# Patient Record
Sex: Female | Born: 1940 | Race: Black or African American | Hispanic: No | Smoking: Former smoker
Health system: Southern US, Community
[De-identification: ages and names within clinical notes are randomized; demographics above are authoritative.]

## PROBLEM LIST (undated history)

## (undated) DIAGNOSIS — E785 Hyperlipidemia, unspecified: Secondary | ICD-10-CM

## (undated) DIAGNOSIS — H9192 Unspecified hearing loss, left ear: Secondary | ICD-10-CM

## (undated) DIAGNOSIS — J302 Other seasonal allergic rhinitis: Secondary | ICD-10-CM

## (undated) HISTORY — DX: Unspecified hearing loss, left ear: H91.92

## (undated) HISTORY — PX: TUMOR EXCISION: SHX421

## (undated) HISTORY — PX: BREAST LUMPECTOMY: SHX2

## (undated) HISTORY — DX: Hyperlipidemia, unspecified: E78.5

## (undated) HISTORY — DX: Other seasonal allergic rhinitis: J30.2

---

## 2004-10-04 ENCOUNTER — Ambulatory Visit: Payer: Self-pay | Admitting: Internal Medicine

## 2004-10-09 ENCOUNTER — Ambulatory Visit: Payer: Self-pay | Admitting: Internal Medicine

## 2004-10-23 ENCOUNTER — Ambulatory Visit: Payer: Self-pay | Admitting: Internal Medicine

## 2004-11-19 ENCOUNTER — Ambulatory Visit: Payer: Self-pay | Admitting: Internal Medicine

## 2004-12-24 ENCOUNTER — Ambulatory Visit: Payer: Self-pay | Admitting: Internal Medicine

## 2004-12-25 ENCOUNTER — Ambulatory Visit (HOSPITAL_COMMUNITY): Admission: RE | Admit: 2004-12-25 | Discharge: 2004-12-25 | Payer: Self-pay | Admitting: Internal Medicine

## 2005-05-30 ENCOUNTER — Ambulatory Visit: Payer: Self-pay | Admitting: Internal Medicine

## 2005-06-11 ENCOUNTER — Emergency Department (HOSPITAL_COMMUNITY): Admission: EM | Admit: 2005-06-11 | Discharge: 2005-06-11 | Payer: Self-pay | Admitting: Emergency Medicine

## 2005-06-16 ENCOUNTER — Encounter: Admission: RE | Admit: 2005-06-16 | Discharge: 2005-06-16 | Payer: Self-pay | Admitting: Otolaryngology

## 2005-07-31 ENCOUNTER — Ambulatory Visit: Payer: Self-pay | Admitting: Internal Medicine

## 2005-08-13 ENCOUNTER — Ambulatory Visit: Payer: Self-pay | Admitting: Internal Medicine

## 2005-08-18 ENCOUNTER — Ambulatory Visit: Payer: Self-pay | Admitting: Gastroenterology

## 2005-08-27 ENCOUNTER — Encounter (INDEPENDENT_AMBULATORY_CARE_PROVIDER_SITE_OTHER): Payer: Self-pay | Admitting: *Deleted

## 2005-08-27 ENCOUNTER — Ambulatory Visit: Payer: Self-pay | Admitting: Gastroenterology

## 2005-12-28 ENCOUNTER — Emergency Department (HOSPITAL_COMMUNITY): Admission: EM | Admit: 2005-12-28 | Discharge: 2005-12-28 | Payer: Self-pay | Admitting: Emergency Medicine

## 2006-01-23 ENCOUNTER — Ambulatory Visit: Payer: Self-pay | Admitting: Hospitalist

## 2006-02-03 ENCOUNTER — Ambulatory Visit: Payer: Self-pay | Admitting: Internal Medicine

## 2006-02-09 ENCOUNTER — Ambulatory Visit (HOSPITAL_COMMUNITY): Admission: RE | Admit: 2006-02-09 | Discharge: 2006-02-09 | Payer: Self-pay | Admitting: Internal Medicine

## 2006-02-20 DIAGNOSIS — E119 Type 2 diabetes mellitus without complications: Secondary | ICD-10-CM | POA: Insufficient documentation

## 2006-04-30 DIAGNOSIS — D126 Benign neoplasm of colon, unspecified: Secondary | ICD-10-CM | POA: Insufficient documentation

## 2006-04-30 DIAGNOSIS — E785 Hyperlipidemia, unspecified: Secondary | ICD-10-CM | POA: Insufficient documentation

## 2006-04-30 DIAGNOSIS — H9319 Tinnitus, unspecified ear: Secondary | ICD-10-CM | POA: Insufficient documentation

## 2006-04-30 DIAGNOSIS — H9190 Unspecified hearing loss, unspecified ear: Secondary | ICD-10-CM | POA: Insufficient documentation

## 2007-02-23 ENCOUNTER — Ambulatory Visit (HOSPITAL_COMMUNITY): Admission: RE | Admit: 2007-02-23 | Discharge: 2007-02-23 | Payer: Self-pay | Admitting: Obstetrics & Gynecology

## 2008-02-25 ENCOUNTER — Ambulatory Visit (HOSPITAL_COMMUNITY): Admission: RE | Admit: 2008-02-25 | Discharge: 2008-02-25 | Payer: Self-pay | Admitting: Obstetrics & Gynecology

## 2009-02-26 ENCOUNTER — Ambulatory Visit (HOSPITAL_COMMUNITY): Admission: RE | Admit: 2009-02-26 | Discharge: 2009-02-26 | Payer: Self-pay | Admitting: Obstetrics & Gynecology

## 2009-04-15 ENCOUNTER — Encounter: Payer: Self-pay | Admitting: Internal Medicine

## 2009-04-16 ENCOUNTER — Ambulatory Visit: Payer: Self-pay | Admitting: Internal Medicine

## 2009-04-16 LAB — CONVERTED CEMR LAB
Albumin: 4.5 g/dL (ref 3.5–5.2)
BUN: 7 mg/dL (ref 6–23)
CO2: 27 meq/L (ref 19–32)
Candida species: NEGATIVE
Cholesterol: 242 mg/dL — ABNORMAL HIGH (ref 0–200)
Gardnerella vaginalis: NEGATIVE
Glucose, Bld: 114 mg/dL — ABNORMAL HIGH (ref 70–99)
HCT: 41 % (ref 36.0–46.0)
Hemoglobin: 13.3 g/dL (ref 12.0–15.0)
MCV: 81.7 fL (ref >=78.0)
RBC: 5.02 M/uL (ref 3.87–5.11)
Sodium: 142 meq/L (ref 135–145)
Total Bilirubin: 0.7 mg/dL (ref 0.3–1.2)
Total Protein: 6.9 g/dL (ref 6.0–8.3)
Triglycerides: 130 mg/dL (ref <150)
VLDL: 26 mg/dL (ref 0–40)
WBC: 6.4 10*3/uL (ref 4.0–10.5)

## 2009-04-19 ENCOUNTER — Encounter: Payer: Self-pay | Admitting: Internal Medicine

## 2010-03-15 ENCOUNTER — Ambulatory Visit (HOSPITAL_COMMUNITY): Admission: RE | Admit: 2010-03-15 | Discharge: 2010-03-15 | Payer: Self-pay | Admitting: Family Medicine

## 2010-03-21 ENCOUNTER — Encounter: Payer: Self-pay | Admitting: Internal Medicine

## 2010-06-04 NOTE — Miscellaneous (Signed)
Summary: Mammogram results  Clinical Lists Changes  Observations: Added new observation of PRIMARY MD: Nelda Bucks DO (03/21/2010 14:22)      Complete Medication List: 1)  Multivitamins Tabs (Multiple vitamin) .... Take one tablet by mouth once daily also take vema a dietary supplement as directed 2)  Os-cal 500 + D 500-400 Mg-unit Chew (Calcium carbonate-vitamin d) .... Take one tablet by mouth once daily

## 2010-08-06 LAB — GLUCOSE, CAPILLARY: Glucose-Capillary: 123 mg/dL — ABNORMAL HIGH (ref 70–99)

## 2010-09-27 ENCOUNTER — Emergency Department (HOSPITAL_COMMUNITY)
Admission: EM | Admit: 2010-09-27 | Discharge: 2010-09-27 | Disposition: A | Discharge status: Home / Self Care | Payer: Medicare Other | Attending: Emergency Medicine | Admitting: Emergency Medicine

## 2010-09-27 DIAGNOSIS — L2989 Other pruritus: Secondary | ICD-10-CM | POA: Insufficient documentation

## 2010-09-27 DIAGNOSIS — L298 Other pruritus: Secondary | ICD-10-CM | POA: Insufficient documentation

## 2010-09-27 DIAGNOSIS — R21 Rash and other nonspecific skin eruption: Secondary | ICD-10-CM | POA: Insufficient documentation

## 2010-09-27 DIAGNOSIS — L259 Unspecified contact dermatitis, unspecified cause: Secondary | ICD-10-CM | POA: Insufficient documentation

## 2010-10-07 ENCOUNTER — Encounter: Payer: Self-pay | Admitting: Internal Medicine

## 2011-08-07 ENCOUNTER — Emergency Department (HOSPITAL_COMMUNITY): Payer: No Typology Code available for payment source

## 2011-08-07 ENCOUNTER — Encounter (HOSPITAL_COMMUNITY): Payer: Self-pay | Admitting: Emergency Medicine

## 2011-08-07 ENCOUNTER — Emergency Department (HOSPITAL_COMMUNITY)
Admission: EM | Admit: 2011-08-07 | Discharge: 2011-08-07 | Disposition: A | Discharge status: Home Health | Payer: No Typology Code available for payment source | Attending: Emergency Medicine | Admitting: Emergency Medicine

## 2011-08-07 DIAGNOSIS — M545 Low back pain, unspecified: Secondary | ICD-10-CM | POA: Insufficient documentation

## 2011-08-07 DIAGNOSIS — T07XXXA Unspecified multiple injuries, initial encounter: Secondary | ICD-10-CM | POA: Insufficient documentation

## 2011-08-07 DIAGNOSIS — M25559 Pain in unspecified hip: Secondary | ICD-10-CM | POA: Insufficient documentation

## 2011-08-07 DIAGNOSIS — R079 Chest pain, unspecified: Secondary | ICD-10-CM | POA: Insufficient documentation

## 2011-08-07 DIAGNOSIS — R404 Transient alteration of awareness: Secondary | ICD-10-CM | POA: Insufficient documentation

## 2011-08-07 DIAGNOSIS — E785 Hyperlipidemia, unspecified: Secondary | ICD-10-CM | POA: Insufficient documentation

## 2011-08-07 DIAGNOSIS — M542 Cervicalgia: Secondary | ICD-10-CM | POA: Insufficient documentation

## 2011-08-07 DIAGNOSIS — E119 Type 2 diabetes mellitus without complications: Secondary | ICD-10-CM | POA: Insufficient documentation

## 2011-08-07 LAB — DIFFERENTIAL
Basophils Absolute: 0 10*3/uL (ref 0.0–0.1)
Lymphocytes Relative: 41 % (ref 12–46)
Neutro Abs: 3.4 10*3/uL (ref 1.7–7.7)

## 2011-08-07 LAB — POCT I-STAT, CHEM 8
HCT: 43 % (ref 36.0–46.0)
Hemoglobin: 14.6 g/dL (ref 12.0–15.0)
Potassium: 3.7 mEq/L (ref 3.5–5.1)
Sodium: 142 mEq/L (ref 135–145)
TCO2: 29 mmol/L (ref 0–100)

## 2011-08-07 LAB — CBC
Platelets: 300 10*3/uL (ref 150–400)
RDW: 13.2 % (ref 11.5–15.5)
WBC: 6.5 10*3/uL (ref 4.0–10.5)

## 2011-08-07 LAB — SAMPLE TO BLOOD BANK

## 2011-08-07 MED ORDER — MORPHINE SULFATE 4 MG/ML IJ SOLN
4.0000 mg | Freq: Once | INTRAMUSCULAR | Status: AC
  Administered 2011-08-07: 4 mg via INTRAVENOUS
  Filled 2011-08-07: qty 1

## 2011-08-07 MED ORDER — HYDROCODONE-ACETAMINOPHEN 5-325 MG PO TABS: 1.0000 | ORAL_TABLET | ORAL | Status: AC | PRN

## 2011-08-07 MED ORDER — SODIUM CHLORIDE 0.9 % IV SOLN
INTRAVENOUS | Status: DC
  Administered 2011-08-07: 125 mL/h via INTRAVENOUS

## 2011-08-07 MED ORDER — ONDANSETRON HCL 4 MG/2ML IJ SOLN
4.0000 mg | Freq: Once | INTRAMUSCULAR | Status: AC
  Administered 2011-08-07: 4 mg via INTRAVENOUS
  Filled 2011-08-07: qty 2
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
## 2013-01-21 DIAGNOSIS — M722 Plantar fascial fibromatosis: Secondary | ICD-10-CM | POA: Insufficient documentation

## 2013-01-27 ENCOUNTER — Encounter: Payer: Self-pay | Admitting: *Deleted

## 2013-01-27 DIAGNOSIS — M722 Plantar fascial fibromatosis: Secondary | ICD-10-CM

## 2013-01-27 DIAGNOSIS — J302 Other seasonal allergic rhinitis: Secondary | ICD-10-CM | POA: Insufficient documentation

## 2013-02-04 ENCOUNTER — Ambulatory Visit (INDEPENDENT_AMBULATORY_CARE_PROVIDER_SITE_OTHER): Payer: Medicare Other

## 2013-02-04 VITALS — BP 124/82 | HR 82 | Resp 12 | Ht 63.0 in | Wt 140.0 lb

## 2013-02-04 DIAGNOSIS — M722 Plantar fascial fibromatosis: Secondary | ICD-10-CM

## 2013-02-04 DIAGNOSIS — M7730 Calcaneal spur, unspecified foot: Secondary | ICD-10-CM

## 2013-02-04 DIAGNOSIS — M7732 Calcaneal spur, left foot: Secondary | ICD-10-CM

## 2013-02-04 NOTE — Progress Notes (Signed)
  Subjective:    Patient ID: Lisa Crane, female    DOB: 09/11/1940, 72 y.o.   MRN: [id number]  HPI Lisa Crane presents at this time for followup of plantar fasciitis/heel spur syndrome left foot the patient has been wearing a strapping using Mobic as instructed has complete resolution of symptoms 0/10 on a pain scale. Patient wearing good athletic or walking shoes. And is allowed to steadily return back to her training activities and workout program. On exam at this time there is no residual pain or symptoms of her left foot on palpation or ambulation   Review of Systems  Constitutional: Negative.   HENT: Negative.   Eyes: Negative.   Respiratory: Negative.   Cardiovascular: Negative.   Endocrine: Negative.   Genitourinary: Negative.   Allergic/Immunologic: Positive for environmental allergies.  Neurological: Negative.   Hematological: Negative.   Psychiatric/Behavioral: Negative.        Objective:   Physical Exam  Constitutional: She is oriented to person, place, and time. She appears well-developed and well-nourished.  Cardiovascular: Intact distal pulses.   Musculoskeletal: Normal range of motion.       Left foot: She exhibits no tenderness and no deformity.  There is complete resolution of pain on palpation. Medial calcaneal tubercle or mid band of plantar fascia left foot or left heel. Muscle strength intact and symmetric bilateral  Neurological: She is alert and oriented to person, place, and time. She has normal reflexes.  Skin: Skin is warm and dry.  Skin color texture and turgor unremarkable  Psychiatric: She has a normal mood and affect. Her behavior is normal. Judgment normal.          Assessment & Plan:  Patient has improvement with fascial strapping NSAID therapy with resolution of plantar fasciitis and heel spur syndrome. The only sequela I mild rash produced by the tape which patient will treat with topical Neosporin or Benadryl as recommend. Patient may  resume all activity a steady basis, while maintaining appropriate shoe gear and all-time  Patient may be a candidate for orthoses in the future if symptoms were to recur or exacerbate it  Alvan Dame DPM

## 2013-02-04 NOTE — Patient Instructions (Signed)

## 2013-04-21 ENCOUNTER — Emergency Department (HOSPITAL_COMMUNITY): Payer: Medicare Other

## 2013-04-21 ENCOUNTER — Emergency Department (HOSPITAL_COMMUNITY)
Admission: EM | Admit: 2013-04-21 | Discharge: 2013-04-21 | Disposition: A | Discharge status: Home / Self Care | Payer: Medicare Other | Attending: Emergency Medicine | Admitting: Emergency Medicine

## 2013-04-21 ENCOUNTER — Encounter (HOSPITAL_COMMUNITY): Payer: Self-pay | Admitting: Emergency Medicine

## 2013-04-21 DIAGNOSIS — Z9109 Other allergy status, other than to drugs and biological substances: Secondary | ICD-10-CM | POA: Insufficient documentation

## 2013-04-21 DIAGNOSIS — Z87891 Personal history of nicotine dependence: Secondary | ICD-10-CM | POA: Insufficient documentation

## 2013-04-21 DIAGNOSIS — M25519 Pain in unspecified shoulder: Secondary | ICD-10-CM | POA: Insufficient documentation

## 2013-04-21 DIAGNOSIS — H9190 Unspecified hearing loss, unspecified ear: Secondary | ICD-10-CM | POA: Insufficient documentation

## 2013-04-21 DIAGNOSIS — M62838 Other muscle spasm: Secondary | ICD-10-CM | POA: Insufficient documentation

## 2013-04-21 DIAGNOSIS — M25559 Pain in unspecified hip: Secondary | ICD-10-CM | POA: Insufficient documentation

## 2013-04-21 DIAGNOSIS — E119 Type 2 diabetes mellitus without complications: Secondary | ICD-10-CM | POA: Insufficient documentation

## 2013-04-21 DIAGNOSIS — IMO0002 Reserved for concepts with insufficient information to code with codable children: Secondary | ICD-10-CM | POA: Insufficient documentation

## 2013-04-21 DIAGNOSIS — M25552 Pain in left hip: Secondary | ICD-10-CM

## 2013-04-21 DIAGNOSIS — R079 Chest pain, unspecified: Secondary | ICD-10-CM | POA: Insufficient documentation

## 2013-04-21 MED ORDER — TRAMADOL HCL 50 MG PO TABS
50.0000 mg | ORAL_TABLET | Freq: Once | ORAL | Status: AC
  Administered 2013-04-21: 50 mg via ORAL
  Filled 2013-04-21: qty 1

## 2013-04-21 MED ORDER — PREDNISONE 20 MG PO TABS: 40.0000 mg | ORAL_TABLET | Freq: Every day | ORAL | Status: AC

## 2013-04-21 MED ORDER — TRAMADOL HCL 50 MG PO TABS: 50.0000 mg | ORAL_TABLET | Freq: Four times a day (QID) | ORAL | Status: DC | PRN

## 2013-04-21 MED ORDER — PREDNISONE 20 MG PO TABS
40.0000 mg | ORAL_TABLET | Freq: Once | ORAL | Status: AC
  Administered 2013-04-21: 40 mg via ORAL
  Filled 2013-04-21: qty 2

## 2013-04-21 NOTE — ED Notes (Signed)
Pt to xray on stretcher

## 2013-04-21 NOTE — ED Provider Notes (Signed)
CSN: [id number]     Arrival date & time 04/21/13  1420 History   First MD Initiated Contact with Patient 04/21/13 1821     Chief Complaint  Patient presents with  . Hip Pain   (Consider location/radiation/quality/duration/timing/severity/associated sxs/prior Treatment) HPI Comments: Pt denies fall, injury.  She reports she had some weakness in right arm that she was seen for previously.  She thinks whatever it was moved to left shoulder and now is in left hip area.  She feels weak, and has some pain.  No prior h/o stroke, TIA.  No h/o cardiac disease.  She has not taken any meds for this.  Pt admits to having some CP in left shoulder along with the weakness in left shoulder while in the waiting room "due to being there for so long" and it is now gone.  No SOB, sweats, nausea with the CP.    Patient is a 72 y.o. female presenting with hip pain. The history is provided by the patient.  Hip Pain This is a new problem. The current episode started 12 to 24 hours ago. The problem occurs constantly. The problem has not changed since onset.Associated symptoms include chest pain. Pertinent negatives include no headaches and no shortness of breath.    Past Medical History  Diagnosis Date  . Diabetes mellitus   . Hyperlipidemia   . Dyslipidemia   . Tinnitus     resolved after d/c aspirin  . Left ear hearing loss   . Seasonal allergies    Past Surgical History  Procedure Laterality Date  . Tumor excision N/A     ON BACK  . Breast lumpectomy N/A    Family History  Problem Relation Age of Onset  . Stroke Mother    History  Substance Use Topics  . Smoking status: Former Smoker -- 4.00 packs/day for 15 years    Quit date: 05/05/1970  . Smokeless tobacco: Not on file  . Alcohol Use: No   OB History   Grav Para Term Preterm Abortions TAB SAB Ect Mult Living                 Review of Systems  Constitutional: Negative for fever and diaphoresis.  Respiratory: Negative for cough and  shortness of breath.   Cardiovascular: Positive for chest pain.  Gastrointestinal: Negative for nausea.  Musculoskeletal: Positive for arthralgias.  Neurological: Positive for weakness. Negative for numbness and headaches.    Allergies  Aspirin  Home Medications   Current Outpatient Rx  Name  Route  Sig  Dispense  Refill  . predniSONE (DELTASONE) 20 MG tablet   Oral   Take 2 tablets (40 mg total) by mouth daily.   12 tablet   0   . traMADol (ULTRAM) 50 MG tablet   Oral   Take 1 tablet (50 mg total) by mouth every 6 (six) hours as needed.   15 tablet   0    BP 121/51  Pulse 85  Temp(Src) 99.2 F (37.3 C) (Oral)  Resp 20  Ht 5\' 3"  (1.6 m)  Wt 154 lb 12.8 oz (70.217 kg)  BMI 27.43 kg/m2  SpO2 100% Physical Exam  Nursing note and vitals reviewed. Constitutional: She is oriented to person, place, and time. She appears well-developed and well-nourished.  HENT:  Head: Normocephalic and atraumatic.  Eyes: Conjunctivae and EOM are normal. Pupils are equal, round, and reactive to light.  Neck: Normal range of motion. No spinous process tenderness and no muscular tenderness  present.  Cardiovascular: Normal rate, regular rhythm and intact distal pulses.   No murmur heard. Pulmonary/Chest: Effort normal. No respiratory distress. She has no wheezes.  Abdominal: Soft. She exhibits no distension. There is no tenderness. There is no rebound and no guarding.  Musculoskeletal:       Right shoulder: She exhibits normal range of motion, no tenderness, normal pulse and normal strength.       Left shoulder: She exhibits normal range of motion, no tenderness, normal pulse and normal strength.       Right hip: She exhibits normal range of motion, normal strength, no tenderness and no deformity.       Left hip: She exhibits decreased strength. She exhibits normal range of motion, no tenderness and no deformity.       Lumbar back: She exhibits no tenderness, no bony tenderness, no swelling,  no pain, no spasm and normal pulse.  Neurological: She is alert and oriented to person, place, and time. She exhibits normal muscle tone. Coordination normal.  Skin: Skin is warm and dry. No rash noted. She is not diaphoretic. No pallor.  Psychiatric: She has a normal mood and affect.    ED Course  Procedures (including critical care time) Labs Review Labs Reviewed - No data to display Imaging Review Dg Hip Complete Left  04/21/2013   CLINICAL DATA:  Left hip pain  EXAM: LEFT HIP - COMPLETE 2+ VIEW  COMPARISON:  08/06/2012 pelvic radiograph  FINDINGS: Osseous demineralization.  Joint spaces preserved.  No acute fracture, dislocation, or bone destruction.  Probable phlebolith projecting over left sacrum.  Minimal osteitis pubis.  Focal area of sclerosis at right ischium unchanged since 08/07/2011.  IMPRESSION: No acute abnormalities.   Electronically Signed   By: Ulyses Southward M.D.   On: 04/21/2013 19:40    EKG Interpretation    Date/Time:  Thursday April 21 2013 17:53:33 EST Ventricular Rate:  77 PR Interval:  162 QRS Duration: 88 QT Interval:  376 QTC Calculation: 425 R Axis:   78 Text Interpretation:  Normal sinus rhythm Nonspecific T wave abnormality , new Borderline ECG Confirmed by [facility]  MD, MICHEAL ([id number]) on 04/21/2013 7:05:35 PM           Room air saturation is 96% and I interpret this to be adequate  Plain films reviewed.  Pt is reassured.  NO evidence of TIA, CVA.  No acute ischemia on ECG and pt has no CP at time of assessment or at any time during ED evaluation.  Pt's symptoms in arms were transient, migratory.  Pt able to ambulate, no emergent condition.  Can follow up with PCP.  Symptom treatment and steroids Rx given.  MDM   1. Pain in left hip      Patient denies any injury. No lumbar pain, spasms or step-offs. Patient has good range of motion of left hip without significant change in pain. She does have 4/5 strength to left hip extension, 5 over 5 distal  strength with plantar and dorsi flexion of ankle. Gross sensation is intact left lower extremity. Plan will be to get plain films of her hip. I suspect the patient may have some sciatica discomfort. No great explanation for why the patient had transient symptoms first in her right shoulder than her left. Doubt this is being caused by transient ischemic activity.  ECG obtained from triage due to complaint of CP.  Given no SOB, nausea and pain was very transient, doubt ACS.  ECG shows subtle  non specific changes, doubt this is ACS.      Gavin Pound. Rhyder Bratz, MD 04/23/13 [phone]

## 2013-04-21 NOTE — ED Notes (Signed)
Pt reports right arm hurting on Friday, pain is relieved but now having pain all over and specifically to left hip, no hx of injury. Ambulatory at triage, no distress noted.

## 2013-04-21 NOTE — ED Notes (Signed)
Pt in the waiting room sts now she is having chest pain

## 2013-06-16 ENCOUNTER — Emergency Department (HOSPITAL_COMMUNITY)
Admission: EM | Admit: 2013-06-16 | Discharge: 2013-06-16 | Discharge status: Absent without leave | Payer: Medicare Other | Source: Home / Self Care

## 2014-03-10 ENCOUNTER — Encounter: Payer: Self-pay | Admitting: Gastroenterology

## 2014-07-17 ENCOUNTER — Encounter (HOSPITAL_COMMUNITY): Payer: Self-pay | Admitting: Emergency Medicine

## 2014-07-17 ENCOUNTER — Emergency Department (HOSPITAL_COMMUNITY): Payer: Medicare HMO

## 2014-07-17 ENCOUNTER — Emergency Department (HOSPITAL_COMMUNITY)
Admission: EM | Admit: 2014-07-17 | Discharge: 2014-07-17 | Disposition: A | Discharge status: Home / Self Care | Payer: Medicare HMO | Attending: Emergency Medicine | Admitting: Emergency Medicine

## 2014-07-17 DIAGNOSIS — S99911A Unspecified injury of right ankle, initial encounter: Secondary | ICD-10-CM | POA: Diagnosis not present

## 2014-07-17 DIAGNOSIS — S8991XA Unspecified injury of right lower leg, initial encounter: Secondary | ICD-10-CM | POA: Insufficient documentation

## 2014-07-17 DIAGNOSIS — S79911A Unspecified injury of right hip, initial encounter: Secondary | ICD-10-CM | POA: Insufficient documentation

## 2014-07-17 DIAGNOSIS — E119 Type 2 diabetes mellitus without complications: Secondary | ICD-10-CM | POA: Insufficient documentation

## 2014-07-17 DIAGNOSIS — Y9389 Activity, other specified: Secondary | ICD-10-CM | POA: Insufficient documentation

## 2014-07-17 DIAGNOSIS — H9192 Unspecified hearing loss, left ear: Secondary | ICD-10-CM | POA: Insufficient documentation

## 2014-07-17 DIAGNOSIS — M25571 Pain in right ankle and joints of right foot: Secondary | ICD-10-CM

## 2014-07-17 DIAGNOSIS — Z87891 Personal history of nicotine dependence: Secondary | ICD-10-CM | POA: Insufficient documentation

## 2014-07-17 DIAGNOSIS — W000XXA Fall on same level due to ice and snow, initial encounter: Secondary | ICD-10-CM | POA: Insufficient documentation

## 2014-07-17 DIAGNOSIS — R52 Pain, unspecified: Secondary | ICD-10-CM

## 2014-07-17 DIAGNOSIS — Z7952 Long term (current) use of systemic steroids: Secondary | ICD-10-CM | POA: Diagnosis not present

## 2014-07-17 DIAGNOSIS — M25561 Pain in right knee: Secondary | ICD-10-CM

## 2014-07-17 DIAGNOSIS — Y998 Other external cause status: Secondary | ICD-10-CM | POA: Insufficient documentation

## 2014-07-17 DIAGNOSIS — Y9289 Other specified places as the place of occurrence of the external cause: Secondary | ICD-10-CM | POA: Insufficient documentation

## 2014-07-17 MED ORDER — TRAMADOL HCL 50 MG PO TABS: 50.0000 mg | ORAL_TABLET | Freq: Four times a day (QID) | ORAL | Status: AC | PRN

## 2014-07-17 NOTE — Discharge Instructions (Signed)
Read the information below.  Use the prescribed medication as directed.  Please discuss all new medications with your pharmacist.  You may return to the Emergency Department at any time for worsening condition or any new symptoms that concern you.   If you develop fever, uncontrolled pain, weakness or numbness of the extremity, severe discoloration of the skin, or you are unable to walk, return to the ER for a recheck.      Ankle Pain Ankle pain is a common symptom. The bones, cartilage, tendons, and muscles of the ankle joint perform a lot of work each day. The ankle joint holds your body weight and allows you to move around. Ankle pain can occur on either side or back of 1 or both ankles. Ankle pain may be sharp and burning or dull and aching. There may be tenderness, stiffness, redness, or warmth around the ankle. The pain occurs more often when a person walks or puts pressure on the ankle. CAUSES  There are many reasons ankle pain can develop. It is important to work with your caregiver to identify the cause since many conditions can impact the bones, cartilage, muscles, and tendons. Causes for ankle pain include:  Injury, including a break (fracture), sprain, or strain often due to a fall, sports, or a high-impact activity.  Swelling (inflammation) of a tendon (tendonitis).  Achilles tendon rupture.  Ankle instability after repeated sprains and strains.  Poor foot alignment.  Pressure on a nerve (tarsal tunnel syndrome).  Arthritis in the ankle or the lining of the ankle.  Crystal formation in the ankle (gout or pseudogout). DIAGNOSIS  A diagnosis is based on your medical history, your symptoms, results of your physical exam, and results of diagnostic tests. Diagnostic tests may include X-ray exams or a computerized magnetic scan (magnetic resonance imaging, MRI). TREATMENT  Treatment will depend on the cause of your ankle pain and may include:  Keeping pressure off the ankle and  limiting activities.  Using crutches or other walking support (a cane or brace).  Using rest, ice, compression, and elevation.  Participating in physical therapy or home exercises.  Wearing shoe inserts or special shoes.  Losing weight.  Taking medications to reduce pain or swelling or receiving an injection.  Undergoing surgery. HOME CARE INSTRUCTIONS   Only take over-the-counter or prescription medicines for pain, discomfort, or fever as directed by your caregiver.  Put ice on the injured area.  Put ice in a plastic bag.  Place a towel between your skin and the bag.  Leave the ice on for 15-20 minutes at a time, 03-04 times a day.  Keep your leg raised (elevated) when possible to lessen swelling.  Avoid activities that cause ankle pain.  Follow specific exercises as directed by your caregiver.  Record how often you have ankle pain, the location of the pain, and what it feels like. This information may be helpful to you and your caregiver.  Ask your caregiver about returning to work or sports and whether you should drive.  Follow up with your caregiver for further examination, therapy, or testing as directed. SEEK MEDICAL CARE IF:   Pain or swelling continues or worsens beyond 1 week.  You have an oral temperature above 102 F (38.9 C).  You are feeling unwell or have chills.  You are having an increasingly difficult time with walking.  You have loss of sensation or other new symptoms.  You have questions or concerns. MAKE SURE YOU:   Understand these instructions.  Will watch your condition.  Will get help right away if you are not doing well or get worse. Document Released: 10/09/2009 Document Revised: 07/14/2011 Document Reviewed: 10/09/2009 [facility] Patient Information 2015 [place], [place]. This information is not intended to replace advice given to you by your health care provider. Make sure you discuss any questions you have with your health care  provider.  Knee Pain The knee is the complex joint between your thigh and your lower leg. It is made up of bones, tendons, ligaments, and cartilage. The bones that make up the knee are:  The femur in the thigh.  The tibia and fibula in the lower leg.  The patella or kneecap riding in the groove on the lower femur. CAUSES  Knee pain is a common complaint with many causes. A few of these causes are:  Injury, such as:  A ruptured ligament or tendon injury.  Torn cartilage.  Medical conditions, such as:  Gout  Arthritis  Infections  Overuse, over training, or overdoing a physical activity. Knee pain can be minor or severe. Knee pain can accompany debilitating injury. Minor knee problems often respond well to self-care measures or get well on their own. More serious injuries may need medical intervention or even surgery. SYMPTOMS The knee is complex. Symptoms of knee problems can vary widely. Some of the problems are:  Pain with movement and weight bearing.  Swelling and tenderness.  Buckling of the knee.  Inability to straighten or extend your knee.  Your knee locks and you cannot straighten it.  Warmth and redness with pain and fever.  Deformity or dislocation of the kneecap. DIAGNOSIS  Determining what is wrong may be very straight forward such as when there is an injury. It can also be challenging because of the complexity of the knee. Tests to make a diagnosis may include:  Your caregiver taking a history and doing a physical exam.  Routine X-rays can be used to rule out other problems. X-rays will not reveal a cartilage tear. Some injuries of the knee can be diagnosed by:  Arthroscopy a surgical technique by which a small video camera is inserted through tiny incisions on the sides of the knee. This procedure is used to examine and repair internal knee joint problems. Tiny instruments can be used during arthroscopy to repair the torn knee cartilage  (meniscus).  Arthrography is a radiology technique. A contrast liquid is directly injected into the knee joint. Internal structures of the knee joint then become visible on X-ray film.  An MRI scan is a non X-ray radiology procedure in which magnetic fields and a computer produce two- or three-dimensional images of the inside of the knee. Cartilage tears are often visible using an MRI scanner. MRI scans have largely replaced arthrography in diagnosing cartilage tears of the knee.  Blood work.  Examination of the fluid that helps to lubricate the knee joint (synovial fluid). This is done by taking a sample out using a needle and a syringe. TREATMENT The treatment of knee problems depends on the cause. Some of these treatments are:  Depending on the injury, proper casting, splinting, surgery, or physical therapy care will be needed.  Give yourself adequate recovery time. Do not overuse your joints. If you begin to get sore during workout routines, back off. Slow down or do fewer repetitions.  For repetitive activities such as cycling or running, maintain your strength and nutrition.  Alternate muscle groups. For example, if you are a weight lifter, work the  upper body on one day and the lower body the next.  Either tight or weak muscles do not give the proper support for your knee. Tight or weak muscles do not absorb the stress placed on the knee joint. Keep the muscles surrounding the knee strong.  Take care of mechanical problems.  If you have flat feet, orthotics or special shoes may help. See your caregiver if you need help.  Arch supports, sometimes with wedges on the inner or outer aspect of the heel, can help. These can shift pressure away from the side of the knee most bothered by osteoarthritis.  A brace called an "unloader" brace also may be used to help ease the pressure on the most arthritic side of the knee.  If your caregiver has prescribed crutches, braces, wraps or ice,  use as directed. The acronym for this is PRICE. This means protection, rest, ice, compression, and elevation.  Nonsteroidal anti-inflammatory drugs (NSAIDs), can help relieve pain. But if taken immediately after an injury, they may actually increase swelling. Take NSAIDs with food in your stomach. Stop them if you develop stomach problems. Do not take these if you have a history of ulcers, stomach pain, or bleeding from the bowel. Do not take without your caregiver's approval if you have problems with fluid retention, heart failure, or kidney problems.  For ongoing knee problems, physical therapy may be helpful.  Glucosamine and chondroitin are over-the-counter dietary supplements. Both may help relieve the pain of osteoarthritis in the knee. These medicines are different from the usual anti-inflammatory drugs. Glucosamine may decrease the rate of cartilage destruction.  Injections of a corticosteroid drug into your knee joint may help reduce the symptoms of an arthritis flare-up. They may provide pain relief that lasts a few months. You may have to wait a few months between injections. The injections do have a small increased risk of infection, water retention, and elevated blood sugar levels.  Hyaluronic acid injected into damaged joints may ease pain and provide lubrication. These injections may work by reducing inflammation. A series of shots may give relief for as long as 6 months.  Topical painkillers. Applying certain ointments to your skin may help relieve the pain and stiffness of osteoarthritis. Ask your pharmacist for suggestions. Many over the-counter products are approved for temporary relief of arthritis pain.  In some countries, doctors often prescribe topical NSAIDs for relief of chronic conditions such as arthritis and tendinitis. A review of treatment with NSAID creams found that they worked as well as oral medications but without the serious side effects. PREVENTION  Maintain a  healthy weight. Extra pounds put more strain on your joints.  Get strong, stay limber. Weak muscles are a common cause of knee injuries. Stretching is important. Include flexibility exercises in your workouts.  Be smart about exercise. If you have osteoarthritis, chronic knee pain or recurring injuries, you may need to change the way you exercise. This does not mean you have to stop being active. If your knees ache after jogging or playing basketball, consider switching to swimming, water aerobics, or other low-impact activities, at least for a few days a week. Sometimes limiting high-impact activities will provide relief.  Make sure your shoes fit well. Choose footwear that is right for your sport.  Protect your knees. Use the proper gear for knee-sensitive activities. Use kneepads when playing volleyball or laying carpet. Buckle your seat belt every time you drive. Most shattered kneecaps occur in car accidents.  Rest when you are  tired. SEEK MEDICAL CARE IF:  You have knee pain that is continual and does not seem to be getting better.  SEEK IMMEDIATE MEDICAL CARE IF:  Your knee joint feels hot to the touch and you have a high fever. MAKE SURE YOU:   Understand these instructions.  Will watch your condition.  Will get help right away if you are not doing well or get worse. Document Released: 02/16/2007 Document Revised: 07/14/2011 Document Reviewed: 02/16/2007 [facility] Patient Information 2015 [place], [place]. This information is not intended to replace advice given to you by your health care provider. Make sure you discuss any questions you have with your health care provider.

## 2014-07-17 NOTE — ED Notes (Signed)
Declined W/C at D/C and was escorted to lobby by RN. 

## 2014-07-17 NOTE — ED Provider Notes (Signed)
CSN: [id number]     Arrival date & time 07/17/14  1326 History  This chart was scribed for non-physician practitioner, Clayton Bibles, PA-C, working with Malvin Johns, MD by Ladene Artist, ED Scribe. This patient was seen in room TR07C/TR07C and the patient's care was started at 3:56 PM.   Chief Complaint  Patient presents with  . Knee Pain  . Foot Pain   The history is provided by the patient. No language interpreter was used.   HPI Comments: Lisa Crane is a 74 y.o. female, with a h/o DM, hyperlipidemia, who presents to the Emergency Department complaining of a slip and fall that occurred 2 weeks ago when it snowed. Pt reports persistent R foot and R knee pain since fall that is exacerbated with movement and bearing weight.  She has been able to walk around her home on a flat surface but had to be carried out of the house as she was not able to traverse the stairs due to pain. She denies hitting her head, LOC, weakness, numbness, fever, chest pain, SOB, myalgias, nausea, vomiting. Pt has been treating with ice and Advil with minimal relief. No h/o gout.   Past Medical History  Diagnosis Date  . Diabetes mellitus   . Hyperlipidemia   . Dyslipidemia   . Tinnitus     resolved after d/c aspirin  . Left ear hearing loss   . Seasonal allergies    Past Surgical History  Procedure Laterality Date  . Tumor excision N/A     ON BACK  . Breast lumpectomy N/A    Family History  Problem Relation Age of Onset  . Stroke Mother    History  Substance Use Topics  . Smoking status: Former Smoker -- 4.00 packs/day for 15 years    Quit date: 05/05/1970  . Smokeless tobacco: Not on file  . Alcohol Use: No   OB History    No data available     Review of Systems  Constitutional: Negative for fever and chills.  Respiratory: Negative for shortness of breath.   Cardiovascular: Negative for chest pain.  Gastrointestinal: Negative for nausea, vomiting and abdominal pain.  Musculoskeletal:  Positive for arthralgias. Negative for myalgias and back pain.  Skin: Negative for color change, pallor, rash and wound.  Allergic/Immunologic: Positive for immunocompromised state (diabetic).  Neurological: Negative for weakness, numbness and headaches.  Hematological: Does not bruise/bleed easily.  Psychiatric/Behavioral: Negative for self-injury (accidental, mechanical fall).   Allergies  Aspirin  Home Medications   Prior to Admission medications   Medication Sig Start Date End Date Taking? Authorizing Provider  predniSONE (DELTASONE) 20 MG tablet Take 2 tablets (40 mg total) by mouth daily. 04/21/13   Kingsley Spittle, MD  traMADol (ULTRAM) 50 MG tablet Take 1 tablet (50 mg total) by mouth every 6 (six) hours as needed. 04/21/13   Kingsley Spittle, MD   BP 115/55 mmHg  Pulse 87  Temp(Src) 99.1 F (37.3 C) (Oral)  Resp 18  SpO2 100% Physical Exam  Constitutional: She appears well-developed and well-nourished. No distress.  HENT:  Head: Normocephalic and atraumatic.  Neck: Neck supple.  Cardiovascular: Normal rate.   Pulmonary/Chest: Effort normal. She exhibits no tenderness.  Abdominal: Soft. There is no tenderness.  Musculoskeletal: Normal range of motion. She exhibits no edema.       Legs:      Feet:  Right hip with pain with passive ROM. Right knee without focal tenderness, no erythema, edema, warmth, mild pain with anterior drawer.  Right ankle with medial edema and tenderness, mild erythema.  Dorsalis pedis pulses intact.  Sensation intact.    Neurological: She is alert.  Skin: She is not diaphoretic.  Nursing note and vitals reviewed.  ED Course  Procedures (including critical care time) DIAGNOSTIC STUDIES: Oxygen Saturation is 100% on RA, normal by my interpretation.    COORDINATION OF CARE: 4:00 PM-Discussed treatment plan which includes XR with pt at bedside and pt agreed to plan.   Labs Review Labs Reviewed - No data to display  Imaging Review Dg Knee  Complete 4 Views Right  07/17/2014   CLINICAL DATA:  The patient reports she slipped in snow 3 weeks ago with a twisting injury of the right knee. Continued pain. Initial encounter.  EXAM: RIGHT KNEE - COMPLETE 4+ VIEW  COMPARISON:  None.  FINDINGS: No acute bony or joint abnormality is identified. There is no joint effusion. Joint spaces are preserved. Mild chondrocalcinosis is noted.  IMPRESSION: No acute abnormality or finding to explain the patient's symptoms.   Electronically Signed   By: Inge Rise M.D.   On: 07/17/2014 15:05   Dg Foot Complete Right  07/17/2014   CLINICAL DATA:  Medial right foot pain since slipping in snow 3 weeks ago. Initial encounter.  EXAM: RIGHT FOOT COMPLETE - 3+ VIEW  COMPARISON:  None.  FINDINGS: No acute bony or joint abnormality is identified. The patient appears to be status post hallux valgus repair. Small plantar calcaneal spur is noted. Soft tissues are unremarkable.  IMPRESSION: No acute finding or abnormality to explain the patient's symptoms.   Electronically Signed   By: Inge Rise M.D.   On: 07/17/2014 15:02    EKG Interpretation None      MDM   Final diagnoses:  Pain  Right ankle pain  Right knee pain    Afebrile, nontoxic patient with injury to her right knee and ankle that occurred 2 weeks ago when she slipped and fell in the snow.  Xray of knee and ankle negative, ordered in triage.  I added right hip film that is pending at the change of shift. Neurovascularly intact.  Pt signed out to Delos Haring, PA-C, at change of shift pending hip film.  If negative, anticipate ambulating patient, if able to ambulate well, d/c home with tramadol, PCP vs ortho follow up, with walker prescription.     I personally performed the services described in this documentation, which was scribed in my presence. The recorded information has been reviewed and is accurate.    Clayton Bibles, PA-C 07/17/14 1723  Malvin Johns, MD 07/17/14 1946

## 2014-07-17 NOTE — ED Notes (Signed)
Pt currently in xray

## 2014-07-17 NOTE — ED Notes (Signed)
Pt c/o right foot and knee pain x 2 weeks since slipped on snow; pt sts difficulty walking due to pain

## 2016-01-25 ENCOUNTER — Ambulatory Visit: Payer: Medicare Other | Admitting: *Deleted

## 2016-11-25 DIAGNOSIS — Z79899 Other long term (current) drug therapy: Secondary | ICD-10-CM | POA: Diagnosis not present

## 2016-11-25 DIAGNOSIS — E119 Type 2 diabetes mellitus without complications: Secondary | ICD-10-CM | POA: Diagnosis not present

## 2016-11-25 DIAGNOSIS — E559 Vitamin D deficiency, unspecified: Secondary | ICD-10-CM | POA: Diagnosis not present

## 2016-11-25 DIAGNOSIS — R5383 Other fatigue: Secondary | ICD-10-CM | POA: Diagnosis not present

## 2016-11-25 DIAGNOSIS — E785 Hyperlipidemia, unspecified: Secondary | ICD-10-CM | POA: Diagnosis not present

## 2016-11-25 DIAGNOSIS — E089 Diabetes mellitus due to underlying condition without complications: Secondary | ICD-10-CM | POA: Diagnosis not present

## 2016-11-25 DIAGNOSIS — E08618 Diabetes mellitus due to underlying condition with other diabetic arthropathy: Secondary | ICD-10-CM | POA: Diagnosis not present

## 2017-04-22 DIAGNOSIS — M129 Arthropathy, unspecified: Secondary | ICD-10-CM | POA: Diagnosis not present

## 2017-04-22 DIAGNOSIS — Z79899 Other long term (current) drug therapy: Secondary | ICD-10-CM | POA: Diagnosis not present

## 2017-04-22 DIAGNOSIS — F0390 Unspecified dementia without behavioral disturbance: Secondary | ICD-10-CM | POA: Diagnosis not present

## 2017-04-22 DIAGNOSIS — E089 Diabetes mellitus due to underlying condition without complications: Secondary | ICD-10-CM | POA: Diagnosis not present

## 2017-04-22 DIAGNOSIS — E785 Hyperlipidemia, unspecified: Secondary | ICD-10-CM | POA: Diagnosis not present

## 2017-09-02 DIAGNOSIS — Z79899 Other long term (current) drug therapy: Secondary | ICD-10-CM | POA: Diagnosis not present

## 2017-09-02 DIAGNOSIS — E782 Mixed hyperlipidemia: Secondary | ICD-10-CM | POA: Diagnosis not present

## 2017-09-02 DIAGNOSIS — F0281 Dementia in other diseases classified elsewhere with behavioral disturbance: Secondary | ICD-10-CM | POA: Diagnosis not present

## 2017-09-02 DIAGNOSIS — E089 Diabetes mellitus due to underlying condition without complications: Secondary | ICD-10-CM | POA: Diagnosis not present

## 2017-09-02 DIAGNOSIS — R5383 Other fatigue: Secondary | ICD-10-CM | POA: Diagnosis not present

## 2017-09-02 DIAGNOSIS — M129 Arthropathy, unspecified: Secondary | ICD-10-CM | POA: Diagnosis not present

## 2017-09-02 DIAGNOSIS — G301 Alzheimer's disease with late onset: Secondary | ICD-10-CM | POA: Diagnosis not present

## 2017-09-16 DIAGNOSIS — E119 Type 2 diabetes mellitus without complications: Secondary | ICD-10-CM | POA: Diagnosis not present

## 2017-09-16 DIAGNOSIS — E785 Hyperlipidemia, unspecified: Secondary | ICD-10-CM | POA: Diagnosis not present

## 2017-09-16 DIAGNOSIS — F0390 Unspecified dementia without behavioral disturbance: Secondary | ICD-10-CM | POA: Diagnosis not present

## 2017-09-16 DIAGNOSIS — E559 Vitamin D deficiency, unspecified: Secondary | ICD-10-CM | POA: Diagnosis not present

## 2017-12-30 DIAGNOSIS — F0390 Unspecified dementia without behavioral disturbance: Secondary | ICD-10-CM | POA: Diagnosis not present

## 2017-12-30 DIAGNOSIS — E785 Hyperlipidemia, unspecified: Secondary | ICD-10-CM | POA: Diagnosis not present

## 2017-12-30 DIAGNOSIS — E119 Type 2 diabetes mellitus without complications: Secondary | ICD-10-CM | POA: Diagnosis not present

## 2017-12-30 DIAGNOSIS — E559 Vitamin D deficiency, unspecified: Secondary | ICD-10-CM | POA: Diagnosis not present

## 2017-12-30 DIAGNOSIS — Z79899 Other long term (current) drug therapy: Secondary | ICD-10-CM | POA: Diagnosis not present

## 2018-01-13 DIAGNOSIS — F0390 Unspecified dementia without behavioral disturbance: Secondary | ICD-10-CM | POA: Diagnosis not present

## 2018-01-13 DIAGNOSIS — E559 Vitamin D deficiency, unspecified: Secondary | ICD-10-CM | POA: Diagnosis not present

## 2018-01-13 DIAGNOSIS — E785 Hyperlipidemia, unspecified: Secondary | ICD-10-CM | POA: Diagnosis not present

## 2018-01-13 DIAGNOSIS — E119 Type 2 diabetes mellitus without complications: Secondary | ICD-10-CM | POA: Diagnosis not present

## 2018-05-11 DIAGNOSIS — Z79899 Other long term (current) drug therapy: Secondary | ICD-10-CM | POA: Diagnosis not present

## 2018-05-11 DIAGNOSIS — E785 Hyperlipidemia, unspecified: Secondary | ICD-10-CM | POA: Diagnosis not present

## 2018-05-11 DIAGNOSIS — E559 Vitamin D deficiency, unspecified: Secondary | ICD-10-CM | POA: Diagnosis not present

## 2018-05-11 DIAGNOSIS — E089 Diabetes mellitus due to underlying condition without complications: Secondary | ICD-10-CM | POA: Diagnosis not present

## 2018-05-21 DIAGNOSIS — F0390 Unspecified dementia without behavioral disturbance: Secondary | ICD-10-CM | POA: Diagnosis not present

## 2018-05-21 DIAGNOSIS — E785 Hyperlipidemia, unspecified: Secondary | ICD-10-CM | POA: Diagnosis not present

## 2018-05-21 DIAGNOSIS — E089 Diabetes mellitus due to underlying condition without complications: Secondary | ICD-10-CM | POA: Diagnosis not present

## 2018-05-21 DIAGNOSIS — Z789 Other specified health status: Secondary | ICD-10-CM | POA: Diagnosis not present

## 2018-06-14 DIAGNOSIS — G458 Other transient cerebral ischemic attacks and related syndromes: Secondary | ICD-10-CM | POA: Diagnosis not present

## 2018-06-14 DIAGNOSIS — Z79899 Other long term (current) drug therapy: Secondary | ICD-10-CM | POA: Diagnosis not present

## 2018-06-14 DIAGNOSIS — R262 Difficulty in walking, not elsewhere classified: Secondary | ICD-10-CM | POA: Diagnosis not present

## 2018-06-14 DIAGNOSIS — G4489 Other headache syndrome: Secondary | ICD-10-CM | POA: Diagnosis not present

## 2018-06-14 DIAGNOSIS — F0390 Unspecified dementia without behavioral disturbance: Secondary | ICD-10-CM | POA: Diagnosis not present

## 2018-06-14 DIAGNOSIS — Z982 Presence of cerebrospinal fluid drainage device: Secondary | ICD-10-CM | POA: Diagnosis not present

## 2018-06-14 DIAGNOSIS — N390 Urinary tract infection, site not specified: Secondary | ICD-10-CM | POA: Diagnosis not present

## 2018-06-14 DIAGNOSIS — R29818 Other symptoms and signs involving the nervous system: Secondary | ICD-10-CM | POA: Diagnosis not present

## 2018-06-14 DIAGNOSIS — D259 Leiomyoma of uterus, unspecified: Secondary | ICD-10-CM | POA: Diagnosis not present

## 2018-06-14 DIAGNOSIS — G319 Degenerative disease of nervous system, unspecified: Secondary | ICD-10-CM | POA: Diagnosis not present

## 2018-06-14 DIAGNOSIS — R2689 Other abnormalities of gait and mobility: Secondary | ICD-10-CM | POA: Diagnosis not present

## 2018-06-14 DIAGNOSIS — M5416 Radiculopathy, lumbar region: Secondary | ICD-10-CM | POA: Diagnosis not present

## 2018-06-15 DIAGNOSIS — Z79899 Other long term (current) drug therapy: Secondary | ICD-10-CM | POA: Diagnosis not present

## 2018-06-15 DIAGNOSIS — R2689 Other abnormalities of gait and mobility: Secondary | ICD-10-CM | POA: Diagnosis not present

## 2018-06-15 DIAGNOSIS — N390 Urinary tract infection, site not specified: Secondary | ICD-10-CM | POA: Diagnosis not present

## 2018-06-15 DIAGNOSIS — D259 Leiomyoma of uterus, unspecified: Secondary | ICD-10-CM | POA: Diagnosis not present

## 2018-06-15 DIAGNOSIS — Z982 Presence of cerebrospinal fluid drainage device: Secondary | ICD-10-CM | POA: Diagnosis not present

## 2018-06-15 DIAGNOSIS — G319 Degenerative disease of nervous system, unspecified: Secondary | ICD-10-CM | POA: Diagnosis not present

## 2018-06-15 DIAGNOSIS — F0390 Unspecified dementia without behavioral disturbance: Secondary | ICD-10-CM | POA: Diagnosis not present

## 2018-06-18 DIAGNOSIS — E785 Hyperlipidemia, unspecified: Secondary | ICD-10-CM | POA: Diagnosis not present

## 2018-06-18 DIAGNOSIS — F0390 Unspecified dementia without behavioral disturbance: Secondary | ICD-10-CM | POA: Diagnosis not present

## 2018-06-18 DIAGNOSIS — Z789 Other specified health status: Secondary | ICD-10-CM | POA: Diagnosis not present

## 2018-06-18 DIAGNOSIS — E089 Diabetes mellitus due to underlying condition without complications: Secondary | ICD-10-CM | POA: Diagnosis not present

## 2018-06-21 DIAGNOSIS — F329 Major depressive disorder, single episode, unspecified: Secondary | ICD-10-CM | POA: Diagnosis not present

## 2018-06-21 DIAGNOSIS — Z8673 Personal history of transient ischemic attack (TIA), and cerebral infarction without residual deficits: Secondary | ICD-10-CM | POA: Diagnosis not present

## 2018-06-21 DIAGNOSIS — I10 Essential (primary) hypertension: Secondary | ICD-10-CM | POA: Diagnosis not present

## 2018-06-21 DIAGNOSIS — Z9181 History of falling: Secondary | ICD-10-CM | POA: Diagnosis not present

## 2018-06-21 DIAGNOSIS — N390 Urinary tract infection, site not specified: Secondary | ICD-10-CM | POA: Diagnosis not present

## 2018-06-21 DIAGNOSIS — F0390 Unspecified dementia without behavioral disturbance: Secondary | ICD-10-CM | POA: Diagnosis not present

## 2018-06-29 DIAGNOSIS — Z9181 History of falling: Secondary | ICD-10-CM | POA: Diagnosis not present

## 2018-06-29 DIAGNOSIS — N390 Urinary tract infection, site not specified: Secondary | ICD-10-CM | POA: Diagnosis not present

## 2018-06-29 DIAGNOSIS — F329 Major depressive disorder, single episode, unspecified: Secondary | ICD-10-CM | POA: Diagnosis not present

## 2018-06-29 DIAGNOSIS — Z8673 Personal history of transient ischemic attack (TIA), and cerebral infarction without residual deficits: Secondary | ICD-10-CM | POA: Diagnosis not present

## 2018-06-29 DIAGNOSIS — I10 Essential (primary) hypertension: Secondary | ICD-10-CM | POA: Diagnosis not present

## 2018-06-29 DIAGNOSIS — F0390 Unspecified dementia without behavioral disturbance: Secondary | ICD-10-CM | POA: Diagnosis not present

## 2018-07-05 DIAGNOSIS — Z9181 History of falling: Secondary | ICD-10-CM | POA: Diagnosis not present

## 2018-07-05 DIAGNOSIS — N390 Urinary tract infection, site not specified: Secondary | ICD-10-CM | POA: Diagnosis not present

## 2018-07-05 DIAGNOSIS — I10 Essential (primary) hypertension: Secondary | ICD-10-CM | POA: Diagnosis not present

## 2018-07-05 DIAGNOSIS — F329 Major depressive disorder, single episode, unspecified: Secondary | ICD-10-CM | POA: Diagnosis not present

## 2018-07-05 DIAGNOSIS — Z8673 Personal history of transient ischemic attack (TIA), and cerebral infarction without residual deficits: Secondary | ICD-10-CM | POA: Diagnosis not present

## 2018-07-05 DIAGNOSIS — F0390 Unspecified dementia without behavioral disturbance: Secondary | ICD-10-CM | POA: Diagnosis not present

## 2018-07-12 DIAGNOSIS — I10 Essential (primary) hypertension: Secondary | ICD-10-CM | POA: Diagnosis not present

## 2018-07-12 DIAGNOSIS — F0390 Unspecified dementia without behavioral disturbance: Secondary | ICD-10-CM | POA: Diagnosis not present

## 2018-07-12 DIAGNOSIS — N390 Urinary tract infection, site not specified: Secondary | ICD-10-CM | POA: Diagnosis not present

## 2018-07-12 DIAGNOSIS — Z8673 Personal history of transient ischemic attack (TIA), and cerebral infarction without residual deficits: Secondary | ICD-10-CM | POA: Diagnosis not present

## 2018-07-12 DIAGNOSIS — F329 Major depressive disorder, single episode, unspecified: Secondary | ICD-10-CM | POA: Diagnosis not present

## 2018-07-12 DIAGNOSIS — Z9181 History of falling: Secondary | ICD-10-CM | POA: Diagnosis not present

## 2018-07-20 DIAGNOSIS — F329 Major depressive disorder, single episode, unspecified: Secondary | ICD-10-CM | POA: Diagnosis not present

## 2018-07-20 DIAGNOSIS — Z8673 Personal history of transient ischemic attack (TIA), and cerebral infarction without residual deficits: Secondary | ICD-10-CM | POA: Diagnosis not present

## 2018-07-20 DIAGNOSIS — F0390 Unspecified dementia without behavioral disturbance: Secondary | ICD-10-CM | POA: Diagnosis not present

## 2018-07-20 DIAGNOSIS — Z9181 History of falling: Secondary | ICD-10-CM | POA: Diagnosis not present

## 2018-07-20 DIAGNOSIS — I10 Essential (primary) hypertension: Secondary | ICD-10-CM | POA: Diagnosis not present

## 2018-07-20 DIAGNOSIS — N390 Urinary tract infection, site not specified: Secondary | ICD-10-CM | POA: Diagnosis not present

## 2018-07-26 DIAGNOSIS — I10 Essential (primary) hypertension: Secondary | ICD-10-CM | POA: Diagnosis not present

## 2018-07-26 DIAGNOSIS — N390 Urinary tract infection, site not specified: Secondary | ICD-10-CM | POA: Diagnosis not present

## 2018-07-26 DIAGNOSIS — F0390 Unspecified dementia without behavioral disturbance: Secondary | ICD-10-CM | POA: Diagnosis not present

## 2018-07-26 DIAGNOSIS — Z9181 History of falling: Secondary | ICD-10-CM | POA: Diagnosis not present

## 2018-07-26 DIAGNOSIS — F329 Major depressive disorder, single episode, unspecified: Secondary | ICD-10-CM | POA: Diagnosis not present

## 2018-07-26 DIAGNOSIS — Z8673 Personal history of transient ischemic attack (TIA), and cerebral infarction without residual deficits: Secondary | ICD-10-CM | POA: Diagnosis not present

## 2018-08-02 DIAGNOSIS — Z8673 Personal history of transient ischemic attack (TIA), and cerebral infarction without residual deficits: Secondary | ICD-10-CM | POA: Diagnosis not present

## 2018-08-02 DIAGNOSIS — Z9181 History of falling: Secondary | ICD-10-CM | POA: Diagnosis not present

## 2018-08-02 DIAGNOSIS — N390 Urinary tract infection, site not specified: Secondary | ICD-10-CM | POA: Diagnosis not present

## 2018-08-02 DIAGNOSIS — F329 Major depressive disorder, single episode, unspecified: Secondary | ICD-10-CM | POA: Diagnosis not present

## 2018-08-02 DIAGNOSIS — I10 Essential (primary) hypertension: Secondary | ICD-10-CM | POA: Diagnosis not present

## 2018-08-02 DIAGNOSIS — F0390 Unspecified dementia without behavioral disturbance: Secondary | ICD-10-CM | POA: Diagnosis not present

## 2018-08-09 DIAGNOSIS — F0390 Unspecified dementia without behavioral disturbance: Secondary | ICD-10-CM | POA: Diagnosis not present

## 2018-08-09 DIAGNOSIS — Z8673 Personal history of transient ischemic attack (TIA), and cerebral infarction without residual deficits: Secondary | ICD-10-CM | POA: Diagnosis not present

## 2018-08-09 DIAGNOSIS — F329 Major depressive disorder, single episode, unspecified: Secondary | ICD-10-CM | POA: Diagnosis not present

## 2018-08-09 DIAGNOSIS — I10 Essential (primary) hypertension: Secondary | ICD-10-CM | POA: Diagnosis not present

## 2018-08-09 DIAGNOSIS — Z9181 History of falling: Secondary | ICD-10-CM | POA: Diagnosis not present

## 2018-08-09 DIAGNOSIS — N390 Urinary tract infection, site not specified: Secondary | ICD-10-CM | POA: Diagnosis not present

## 2018-08-17 DIAGNOSIS — N390 Urinary tract infection, site not specified: Secondary | ICD-10-CM | POA: Diagnosis not present

## 2018-08-17 DIAGNOSIS — Z9181 History of falling: Secondary | ICD-10-CM | POA: Diagnosis not present

## 2018-08-17 DIAGNOSIS — F329 Major depressive disorder, single episode, unspecified: Secondary | ICD-10-CM | POA: Diagnosis not present

## 2018-08-17 DIAGNOSIS — Z8673 Personal history of transient ischemic attack (TIA), and cerebral infarction without residual deficits: Secondary | ICD-10-CM | POA: Diagnosis not present

## 2018-08-17 DIAGNOSIS — F0390 Unspecified dementia without behavioral disturbance: Secondary | ICD-10-CM | POA: Diagnosis not present

## 2018-08-17 DIAGNOSIS — I10 Essential (primary) hypertension: Secondary | ICD-10-CM | POA: Diagnosis not present

## 2018-09-01 DIAGNOSIS — F329 Major depressive disorder, single episode, unspecified: Secondary | ICD-10-CM | POA: Diagnosis not present

## 2018-09-01 DIAGNOSIS — Z9181 History of falling: Secondary | ICD-10-CM | POA: Diagnosis not present

## 2018-09-01 DIAGNOSIS — I10 Essential (primary) hypertension: Secondary | ICD-10-CM | POA: Diagnosis not present

## 2018-09-01 DIAGNOSIS — Z8673 Personal history of transient ischemic attack (TIA), and cerebral infarction without residual deficits: Secondary | ICD-10-CM | POA: Diagnosis not present

## 2018-09-01 DIAGNOSIS — F0390 Unspecified dementia without behavioral disturbance: Secondary | ICD-10-CM | POA: Diagnosis not present

## 2018-09-01 DIAGNOSIS — N390 Urinary tract infection, site not specified: Secondary | ICD-10-CM | POA: Diagnosis not present

## 2018-09-15 DIAGNOSIS — Z8673 Personal history of transient ischemic attack (TIA), and cerebral infarction without residual deficits: Secondary | ICD-10-CM | POA: Diagnosis not present

## 2018-09-15 DIAGNOSIS — F0390 Unspecified dementia without behavioral disturbance: Secondary | ICD-10-CM | POA: Diagnosis not present

## 2018-09-15 DIAGNOSIS — I10 Essential (primary) hypertension: Secondary | ICD-10-CM | POA: Diagnosis not present

## 2018-09-15 DIAGNOSIS — Z9181 History of falling: Secondary | ICD-10-CM | POA: Diagnosis not present

## 2018-09-15 DIAGNOSIS — F329 Major depressive disorder, single episode, unspecified: Secondary | ICD-10-CM | POA: Diagnosis not present

## 2018-09-15 DIAGNOSIS — N390 Urinary tract infection, site not specified: Secondary | ICD-10-CM | POA: Diagnosis not present

## 2018-09-28 DIAGNOSIS — I10 Essential (primary) hypertension: Secondary | ICD-10-CM | POA: Diagnosis not present

## 2018-09-28 DIAGNOSIS — F0390 Unspecified dementia without behavioral disturbance: Secondary | ICD-10-CM | POA: Diagnosis not present

## 2018-09-28 DIAGNOSIS — Z8673 Personal history of transient ischemic attack (TIA), and cerebral infarction without residual deficits: Secondary | ICD-10-CM | POA: Diagnosis not present

## 2018-09-28 DIAGNOSIS — Z9181 History of falling: Secondary | ICD-10-CM | POA: Diagnosis not present

## 2018-09-28 DIAGNOSIS — F329 Major depressive disorder, single episode, unspecified: Secondary | ICD-10-CM | POA: Diagnosis not present

## 2018-09-28 DIAGNOSIS — N390 Urinary tract infection, site not specified: Secondary | ICD-10-CM | POA: Diagnosis not present

## 2019-12-21 ENCOUNTER — Other Ambulatory Visit: Payer: Self-pay

## 2019-12-21 ENCOUNTER — Emergency Department (HOSPITAL_COMMUNITY): Payer: Medicare HMO

## 2019-12-21 ENCOUNTER — Encounter (HOSPITAL_COMMUNITY): Payer: Self-pay | Admitting: Emergency Medicine

## 2019-12-21 ENCOUNTER — Emergency Department (HOSPITAL_COMMUNITY)
Admission: EM | Admit: 2019-12-21 | Discharge: 2019-12-25 | Disposition: A | Discharge status: Home / Self Care | Payer: Medicare HMO | Attending: Emergency Medicine | Admitting: Emergency Medicine

## 2019-12-21 DIAGNOSIS — Z20822 Contact with and (suspected) exposure to covid-19: Secondary | ICD-10-CM | POA: Insufficient documentation

## 2019-12-21 DIAGNOSIS — Z87891 Personal history of nicotine dependence: Secondary | ICD-10-CM | POA: Insufficient documentation

## 2019-12-21 DIAGNOSIS — R4182 Altered mental status, unspecified: Secondary | ICD-10-CM | POA: Insufficient documentation

## 2019-12-21 DIAGNOSIS — Z79899 Other long term (current) drug therapy: Secondary | ICD-10-CM | POA: Diagnosis not present

## 2019-12-21 DIAGNOSIS — E119 Type 2 diabetes mellitus without complications: Secondary | ICD-10-CM | POA: Diagnosis not present

## 2019-12-21 DIAGNOSIS — R531 Weakness: Secondary | ICD-10-CM | POA: Diagnosis not present

## 2019-12-21 DIAGNOSIS — R600 Localized edema: Secondary | ICD-10-CM | POA: Diagnosis not present

## 2019-12-21 LAB — CBC WITH DIFFERENTIAL/PLATELET
Abs Immature Granulocytes: 0.06 10*3/uL (ref 0.00–0.07)
Basophils Absolute: 0 10*3/uL (ref 0.0–0.1)
Basophils Relative: 0 %
Eosinophils Absolute: 0 10*3/uL (ref 0.0–0.5)
Eosinophils Relative: 0 %
HCT: 38.8 % (ref 36.0–46.0)
Hemoglobin: 11.9 g/dL — ABNORMAL LOW (ref 12.0–15.0)
Immature Granulocytes: 1 %
Lymphocytes Relative: 21 %
Lymphs Abs: 2.4 10*3/uL (ref 0.7–4.0)
MCH: 26.4 pg (ref 26.0–34.0)
MCHC: 30.7 g/dL (ref 30.0–36.0)
MCV: 86 fL (ref 80.0–100.0)
Monocytes Absolute: 1.3 10*3/uL — ABNORMAL HIGH (ref 0.1–1.0)
Monocytes Relative: 11 %
Neutro Abs: 7.8 10*3/uL — ABNORMAL HIGH (ref 1.7–7.7)
Neutrophils Relative %: 67 %
Platelets: 205 10*3/uL (ref 150–400)
RBC: 4.51 MIL/uL (ref 3.87–5.11)
RDW: 13.9 % (ref 11.5–15.5)
WBC: 11.6 10*3/uL — ABNORMAL HIGH (ref 4.0–10.5)
nRBC: 0 % (ref 0.0–0.2)

## 2019-12-21 NOTE — ED Triage Notes (Signed)
Pt accompanied by her daughter, who is her caregiver, reports two days ago pt started having difficulty walking, has started urinating and defecating on herself, and she developed bilateral leg swelling. Hx alzheimer's/dementia.

## 2019-12-22 ENCOUNTER — Emergency Department (HOSPITAL_BASED_OUTPATIENT_CLINIC_OR_DEPARTMENT_OTHER): Payer: Medicare HMO

## 2019-12-22 ENCOUNTER — Emergency Department (HOSPITAL_COMMUNITY): Payer: Medicare HMO

## 2019-12-22 DIAGNOSIS — M7989 Other specified soft tissue disorders: Secondary | ICD-10-CM

## 2019-12-22 LAB — COMPREHENSIVE METABOLIC PANEL
ALT: 37 U/L (ref 0–44)
AST: 21 U/L (ref 15–41)
Albumin: 3 g/dL — ABNORMAL LOW (ref 3.5–5.0)
Alkaline Phosphatase: 64 U/L (ref 38–126)
Anion gap: 11 (ref 5–15)
BUN: 16 mg/dL (ref 8–23)
CO2: 31 mmol/L (ref 22–32)
Calcium: 9.4 mg/dL (ref 8.9–10.3)
Chloride: 98 mmol/L (ref 98–111)
Creatinine, Ser: 0.99 mg/dL (ref 0.44–1.00)
GFR calc Af Amer: >60 mL/min (ref >60)
GFR calc non Af Amer: 54 mL/min — ABNORMAL LOW (ref >60)
Glucose, Bld: 238 mg/dL — ABNORMAL HIGH (ref 70–99)
Potassium: 3.7 mmol/L (ref 3.5–5.1)
Sodium: 140 mmol/L (ref 135–145)
Total Bilirubin: 1 mg/dL (ref 0.3–1.2)
Total Protein: 6.7 g/dL (ref 6.5–8.1)

## 2019-12-22 LAB — URINALYSIS, ROUTINE W REFLEX MICROSCOPIC
Bilirubin Urine: NEGATIVE
Glucose, UA: >=500 mg/dL — AB
Ketones, ur: NEGATIVE mg/dL
Nitrite: NEGATIVE
Protein, ur: 30 mg/dL — AB
Specific Gravity, Urine: 1.027 (ref 1.005–1.030)
pH: 5 (ref 5.0–8.0)

## 2019-12-22 LAB — SARS CORONAVIRUS 2 BY RT PCR (HOSPITAL ORDER, PERFORMED IN ~~LOC~~ HOSPITAL LAB): SARS Coronavirus 2: NEGATIVE

## 2019-12-22 LAB — BRAIN NATRIURETIC PEPTIDE: B Natriuretic Peptide: 61.6 pg/mL (ref 0.0–100.0)

## 2019-12-22 MED ORDER — SERTRALINE HCL 50 MG PO TABS
50.0000 mg | ORAL_TABLET | Freq: Every day | ORAL | Status: DC
  Administered 2019-12-23 – 2019-12-24 (×3): 50 mg via ORAL
  Filled 2019-12-22 (×3): qty 1

## 2019-12-22 MED ORDER — ACETAMINOPHEN 325 MG PO TABS
650.0000 mg | ORAL_TABLET | Freq: Once | ORAL | Status: AC
  Administered 2019-12-22: 650 mg via ORAL
  Filled 2019-12-22: qty 2

## 2019-12-22 MED ORDER — DONEPEZIL HCL 5 MG PO TABS
10.0000 mg | ORAL_TABLET | Freq: Every day | ORAL | Status: DC
  Administered 2019-12-23 – 2019-12-24 (×3): 10 mg via ORAL
  Filled 2019-12-22: qty 1
  Filled 2019-12-22: qty 2
  Filled 2019-12-22: qty 1

## 2019-12-22 NOTE — NC FL2 (Signed)
  [place] MEDICAID FL2 LEVEL OF CARE SCREENING TOOL     IDENTIFICATION  Patient Name: Lisa Crane Birthdate: November 14, 1940 Sex: female Admission Date (Current Location): 12/21/2019  [facility] and [place] Number:  Herbalist and Address:  [facility], [place] [address] [place],  [address]      Provider Number: [id number]  Attending Physician Name and Address:  Drenda Freeze, MD  Relative Name and Phone Number:  Evaleen, Sant Daughter [phone]    Current Level of Care: Hospital Recommended Level of Care: [place] Prior Approval Number:    Date Approved/Denied:   PASRR Number: [id number]  Discharge Plan: SNF    Current Diagnoses: Patient Active Problem List   Diagnosis Date Noted  . Seasonal allergies   . Plantar fasciitis, left 01/21/2013  . COLONIC POLYPS, ADENOMATOUS 04/30/2006  . DYSLIPIDEMIA 04/30/2006  . TINNITUS 04/30/2006  . LOSS, HEARING NOS 04/30/2006  . DIABETES MELLITUS, TYPE II 02/20/2006    Orientation RESPIRATION BLADDER Height & Weight     Self, Situation, Place  Normal Incontinent (Incontinence new to episode weakness) Weight:   Height:     BEHAVIORAL SYMPTOMS/MOOD NEUROLOGICAL BOWEL NUTRITION STATUS      Incontinent (incontinece new to episode of weakness) Diet (Regular)  AMBULATORY STATUS COMMUNICATION OF NEEDS Skin   Extensive Assist Verbally Normal                       Personal Care Assistance Level of Assistance  Bathing, Feeding, Dressing Bathing Assistance: Maximum assistance Feeding assistance: Limited assistance Dressing Assistance: Maximum assistance     Functional Limitations Info  Sight, Hearing, Speech Sight Info: Adequate Hearing Info: Adequate Speech Info: Adequate    SPECIAL CARE FACTORS FREQUENCY  PT (By licensed PT), OT (By licensed OT)     PT Frequency: 5x weekly OT Frequency: 5x weekly            Contractures Contractures Info: Not  present    Additional Factors Info  Code Status, Allergies Code Status Info: Full Allergies Info: Aspirin           Current Medications (12/22/2019):  This is the current hospital active medication list No current facility-administered medications for this encounter.   Current Outpatient Medications  Medication Sig Dispense Refill  . donepezil (ARICEPT) 10 MG tablet Take 10 mg by mouth daily.    . sertraline (ZOLOFT) 50 MG tablet Take 50 mg by mouth daily.    . predniSONE (DELTASONE) 20 MG tablet Take 2 tablets (40 mg total) by mouth daily. (Patient not taking: Reported on 12/22/2019) 12 tablet 0  . traMADol (ULTRAM) 50 MG tablet Take 1 tablet (50 mg total) by mouth every 6 (six) hours as needed for moderate pain or severe pain. (Patient not taking: Reported on 12/22/2019) 15 tablet 0     Discharge Medications: Please see discharge summary for a list of discharge medications.  Relevant Imaging Results:  Relevant Lab Results:   Additional Information SSN# [id number]  Vergie Living, LCSW

## 2019-12-22 NOTE — Progress Notes (Signed)
CSW sent Pt referral to several area SNFs.  Family would prefer [facility] if bed offer made due to close proximity to home.

## 2019-12-22 NOTE — ED Provider Notes (Signed)
[facility] EMERGENCY DEPARTMENT Provider Note   CSN: [id number] Arrival date & time: 12/21/19  2224     History Chief Complaint  Patient presents with  . Altered Mental Status    Lisa Crane is a 79 y.o. female.  The history is provided by the patient.  Extremity Weakness This is a new problem. The current episode started more than 2 days ago. The problem occurs constantly. The problem has not changed since onset.Pertinent negatives include no chest pain, no abdominal pain, no headaches and no shortness of breath. The symptoms are aggravated by walking. She has tried nothing for the symptoms. The treatment provided no relief.       Past Medical History:  Diagnosis Date  . Diabetes mellitus   . Dyslipidemia   . Hyperlipidemia   . Left ear hearing loss   . Seasonal allergies   . Tinnitus    resolved after d/c aspirin    Patient Active Problem List   Diagnosis Date Noted  . Seasonal allergies   . Plantar fasciitis, left 01/21/2013  . COLONIC POLYPS, ADENOMATOUS 04/30/2006  . DYSLIPIDEMIA 04/30/2006  . TINNITUS 04/30/2006  . LOSS, HEARING NOS 04/30/2006  . DIABETES MELLITUS, TYPE II 02/20/2006    Past Surgical History:  Procedure Laterality Date  . BREAST LUMPECTOMY N/A   . TUMOR EXCISION N/A    ON BACK     OB History   No obstetric history on file.     Family History  Problem Relation Age of Onset  . Stroke Mother     Social History   Tobacco Use  . Smoking status: Former Smoker    Packs/day: 4.00    Years: 15.00    Pack years: 60.00    Quit date: 05/05/1970    Years since quitting: 49.6  Substance Use Topics  . Alcohol use: No  . Drug use: No    Home Medications Prior to Admission medications   Medication Sig Start Date End Date Taking? Authorizing Provider  donepezil (ARICEPT) 10 MG tablet Take 10 mg by mouth daily. 10/04/19  Yes [provider]  sertraline (ZOLOFT) 50 MG tablet Take 50 mg by mouth daily.  10/04/19  Yes [provider]  predniSONE (DELTASONE) 20 MG tablet Take 2 tablets (40 mg total) by mouth daily. Patient not taking: Reported on 12/22/2019 04/21/13   Kingsley Spittle, MD  traMADol (ULTRAM) 50 MG tablet Take 1 tablet (50 mg total) by mouth every 6 (six) hours as needed for moderate pain or severe pain. Patient not taking: Reported on 12/22/2019 07/17/14   Clayton Bibles, PA-C    Allergies    Aspirin  Review of Systems   Review of Systems  Constitutional: Negative for chills and fever.  HENT: Negative for ear pain and sore throat.   Eyes: Negative for pain and visual disturbance.  Respiratory: Negative for cough and shortness of breath.   Cardiovascular: Negative for chest pain and palpitations.  Gastrointestinal: Negative for abdominal pain and vomiting.  Genitourinary: Negative for dysuria and hematuria.  Musculoskeletal: Positive for extremity weakness. Negative for arthralgias and back pain.  Skin: Negative for color change and rash.  Neurological: Negative for seizures, syncope and headaches.       "can't walk"  All other systems reviewed and are negative.   Physical Exam Updated Vital Signs BP (!) 104/52   Pulse 63   Temp 99.9 F (37.7 C) (Oral)   Resp 18   SpO2 99%   Physical Exam  Vitals and nursing note reviewed.  Constitutional:      General: She is not in acute distress.    Appearance: She is well-developed.  HENT:     Head: Normocephalic and atraumatic.  Eyes:     Conjunctiva/sclera: Conjunctivae normal.  Cardiovascular:     Rate and Rhythm: Normal rate and regular rhythm.     Heart sounds: No murmur heard.   Pulmonary:     Effort: Pulmonary effort is normal. No respiratory distress.     Breath sounds: Normal breath sounds.  Abdominal:     Palpations: Abdomen is soft.     Tenderness: There is no abdominal tenderness. There is no guarding or rebound.  Musculoskeletal:     Cervical back: Neck supple.     Comments: 2+ pitting edema to LLE  and 1+ to RLE.  No back tenderness.  2/5 strength in bl LE.    Skin:    General: Skin is warm and dry.     Capillary Refill: Capillary refill takes less than 2 seconds.  Neurological:     General: No focal deficit present.     Mental Status: She is alert. Mental status is at baseline. She is disoriented.     Cranial Nerves: No cranial nerve deficit.     Sensory: No sensory deficit.     Motor: Weakness (bl LE weakness.  ) present.     Comments: No saddle anesthesia.  Sensation intact throughtout bl LE.     ED Results / Procedures / Treatments   Labs (all labs ordered are listed, but only abnormal results are displayed) Labs Reviewed  URINALYSIS, ROUTINE W REFLEX MICROSCOPIC - Abnormal; Notable for the following components:      Result Value   Glucose, UA >=500 (*)    Hgb urine dipstick MODERATE (*)    Protein, ur 30 (*)    Leukocytes,Ua SMALL (*)    Bacteria, UA RARE (*)    All other components within normal limits  COMPREHENSIVE METABOLIC PANEL - Abnormal; Notable for the following components:   Glucose, Bld 238 (*)    Albumin 3.0 (*)    GFR calc non Af Amer 54 (*)    All other components within normal limits  CBC WITH DIFFERENTIAL/PLATELET - Abnormal; Notable for the following components:   WBC 11.6 (*)    Hemoglobin 11.9 (*)    Neutro Abs 7.8 (*)    Monocytes Absolute 1.3 (*)    All other components within normal limits  SARS CORONAVIRUS 2 BY RT PCR (HOSPITAL ORDER, [place] [facility])  URINE CULTURE  BRAIN NATRIURETIC PEPTIDE    EKG EKG Interpretation  Date/Time:  Wednesday December 21 2019 23:00:25 EDT Ventricular Rate:  83 PR Interval:  174 QRS Duration: 80 QT Interval:  378 QTC Calculation: 444 R Axis:   74 Text Interpretation: Normal sinus rhythm Possible Left atrial enlargement Nonspecific ST and T wave abnormality No significant change since last tracing Confirmed by Blanchie Dessert [phone]) on 12/22/2019 9:56:00 PM   Radiology CT Head  Wo Contrast  Result Date: 12/21/2019 CLINICAL DATA:  Altered mental status. EXAM: CT HEAD WITHOUT CONTRAST TECHNIQUE: Contiguous axial images were obtained from the base of the skull through the vertex without intravenous contrast. COMPARISON:  August 07, 2011 FINDINGS: Brain: There is mild to moderate severity cerebral atrophy with widening of the extra-axial spaces and ventricular dilatation. There are areas of decreased attenuation within the white matter tracts of the supratentorial brain, consistent with microvascular  disease changes. Vascular: No hyperdense vessel or unexpected calcification. Skull: Normal. Negative for fracture or focal lesion. Sinuses/Orbits: There is marked severity right maxillary sinus mucosal thickening. Other: None. IMPRESSION: 1. Generalized cerebral atrophy. 2. No acute intracranial abnormality. 3. Right maxillary sinus disease. Electronically Signed   By: Virgina Norfolk M.D.   On: 12/21/2019 23:36   DG Chest Port 1 View  Result Date: 12/22/2019 CLINICAL DATA:  Weakness, chills. EXAM: PORTABLE CHEST 1 VIEW COMPARISON:  Prior chest radiographs 08/07/2011 FINDINGS: Heart size within normal limits. No appreciable airspace consolidation or pulmonary edema. No evidence of pleural effusion or pneumothorax. No acute bony abnormality identified. IMPRESSION: No evidence of acute cardiopulmonary abnormality. Electronically Signed   By: Kellie Simmering DO   On: 12/22/2019 16:43   VAS [place] LOWER EXTREMITY VENOUS (DVT) (ONLY [facility] & [facility])  Result Date: 12/22/2019  Lower Venous DVTStudy Indications: Swelling.  Limitations: Poor ultrasound/tissue interface. Comparison Study: No prior studies. Performing Technologist: Darlin Coco  Examination Guidelines: A complete evaluation includes B-mode imaging, spectral Doppler, color Doppler, and power Doppler as needed of all accessible portions of each vessel. Bilateral testing is considered an integral part of a complete examination. Limited examinations  for reoccurring indications may be performed as noted. The reflux portion of the exam is performed with the patient in reverse Trendelenburg.  +---------+---------------+---------+-----------+----------+---------------+ RIGHT    CompressibilityPhasicitySpontaneityPropertiesThrombus Aging  +---------+---------------+---------+-----------+----------+---------------+ CFV      Full           Yes      Yes                                  +---------+---------------+---------+-----------+----------+---------------+ SFJ      Full                                                         +---------+---------------+---------+-----------+----------+---------------+ FV Prox  Full                                                         +---------+---------------+---------+-----------+----------+---------------+ FV Mid   Full                                                         +---------+---------------+---------+-----------+----------+---------------+ FV DistalFull                                                         +---------+---------------+---------+-----------+----------+---------------+ PFV      Full                                                         +---------+---------------+---------+-----------+----------+---------------+  POP      Full           Yes      Yes                                  +---------+---------------+---------+-----------+----------+---------------+ PTV      Full                                                         +---------+---------------+---------+-----------+----------+---------------+ PERO                    Yes      Yes                  Patent by color +---------+---------------+---------+-----------+----------+---------------+   +---------+---------------+---------+-----------+----------+---------------+ LEFT     CompressibilityPhasicitySpontaneityPropertiesThrombus Aging   +---------+---------------+---------+-----------+----------+---------------+ CFV      Full           Yes      Yes                                  +---------+---------------+---------+-----------+----------+---------------+ SFJ      Full                                                         +---------+---------------+---------+-----------+----------+---------------+ FV Prox  Full                                                         +---------+---------------+---------+-----------+----------+---------------+ FV Mid   Full                                                         +---------+---------------+---------+-----------+----------+---------------+ FV DistalFull                                                         +---------+---------------+---------+-----------+----------+---------------+ PFV      Full                                                         +---------+---------------+---------+-----------+----------+---------------+ POP      Full           Yes      Yes                                  +---------+---------------+---------+-----------+----------+---------------+  PTV      Full                                                         +---------+---------------+---------+-----------+----------+---------------+ PERO                    Yes      Yes                  Patent by color +---------+---------------+---------+-----------+----------+---------------+     Summary: RIGHT: - There is no evidence of deep vein thrombosis in the lower extremity. However, portions of this examination were limited- see technologist comments above.  - No cystic structure found in the popliteal fossa.  LEFT: - There is no evidence of deep vein thrombosis in the lower extremity. However, portions of this examination were limited- see technologist comments above.  - No cystic structure found in the popliteal fossa.  *See table(s) above for measurements and  observations.    Preliminary     Procedures Procedures (including critical care time)  Medications Ordered in ED Medications  donepezil (ARICEPT) tablet 10 mg (has no administration in time range)  sertraline (ZOLOFT) tablet 50 mg (has no administration in time range)  acetaminophen (TYLENOL) tablet 650 mg (650 mg Oral Given 12/22/19 1629)    ED Course  I have reviewed the triage vital signs and the nursing notes.  Pertinent labs & imaging results that were available during my care of the patient were reviewed by me and considered in my medical decision making (see chart for details).    MDM Rules/Calculators/A&P                          79 year old female with history of dementia presents with bilateral lower extremity weakness.  Patient lives with her daughter and daughter states that over the past few days patient has had worsening difficulty walking.  Patient does not complain of any leg pain while sitting however when she tries to stand up she states that she has pain running through her legs.  Patient's daughter states that patient has been urinating and defecating herself because she has been unable to get out of bed and not 2/2 loss of bladder/bowel control.  Patient's daughter also states that patient has been more confused lately but there has not been a steep decline and believe that this may be due to her underlying dementia.  Denies fever, chills, cough, chest pain, shortness of breath, back pain, urinary, bladder incontinence, sensation deficits.  Afebrile vital signs stable.  Exam as above.  Patient is pleasantly confused and quickly forgets commands on physical exam.  2 out of 5 strength in the bilateral lower extremities and right greater than left lower extremity edema.  No back pain on palpation no sensory deficits including no saddle anesthesia.  Labs obtained in triage notable for borderline white count 11.6.  Remainder of CMP and CBC unremarkable.  BNP 61.  CT head  obtained in triage negative for acute intracranial abnormality is notable for generalized cortical atrophy.  Overall patient appears well-appearing however unclear etiology to her lower extremity weakness.  Given the asymmetric swelling will obtain DVT ultrasound to evaluate for DVT which may explain her pain and swelling as well obtain urine studies to rule  out other infectious etiology.  DVT ultrasound negative for DVTs bilaterally.  Chest x-ray unremarkable.  Urinalysis not indicative of UTI.  Covid negative.  Unclear etiology of patient's weakness though may be progression of her underlying Alzheimer's disease.  Physical therapy evaluated the patient at bedside and recommended placement in a long-term rehab.  Case management was consulted as well social work who spoke with the family and several referrals were sent out for patient to be placed in rehab.  Discussed this plan with the family who agreed with this.  Patient is currently awaiting transfer and placement.  Home meds were ordered and patient had no further events during the duration of my shift.  Final Clinical Impression(s) / ED Diagnoses Final diagnoses:  Weakness    Rx / DC Orders ED Discharge Orders    None       Silvestre Gunner, MD 12/22/19 2256    Drenda Freeze, MD 12/26/19 [phone]

## 2019-12-22 NOTE — Evaluation (Signed)
Physical Therapy Evaluation Patient Details Name: Lisa Crane MRN: [id number] DOB: [birth year] Today's Date: 12/22/2019   History of Present Illness  Pt is a 80 y/o female presenting to the ED secondary to difficulty ambulating. PMH includes dementia.   Clinical Impression  Pt presenting with problem above and deficits below. Pt requiring total A to perform basic bed mobility tasks. Complaining of BLE pain and notable swelling throughout BLE. Per daughter in law, pt was very independent up until about 3 days ago. Feel pt would benefit from SNF level therapies at d/c to increase independence and safety.     Follow Up Recommendations SNF    Equipment Recommendations  None recommended by PT    Recommendations for Other Services       Precautions / Restrictions Precautions Precautions: Fall Restrictions Weight Bearing Restrictions: No      Mobility  Bed Mobility Overal bed mobility: Needs Assistance Bed Mobility: Supine to Sit;Sit to Supine     Supine to sit: Total assist Sit to supine: Total assist   General bed mobility comments: Total A for LE and trunk support throughout. Pt grimacing in pain throughout. Further mobility deferred.   Transfers                    Ambulation/Gait                Stairs            Wheelchair Mobility    Modified Rankin (Stroke Patients Only)       Balance Overall balance assessment: Needs assistance Sitting-balance support: No upper extremity supported;Feet supported Sitting balance-Leahy Scale: Poor Sitting balance - Comments: Reliant on at least min A to maintain balance.                                      Pertinent Vitals/Pain Pain Assessment: Faces Faces Pain Scale: Hurts even more Pain Location: BLE Pain Descriptors / Indicators: Grimacing;Guarding Pain Intervention(s): Limited activity within patient's tolerance;Monitored during session;Repositioned    Home Living  Family/patient expects to be discharged to:: Skilled nursing facility                      Prior Function Level of Independence: Independent         Comments: Until ~3 days ago, pt was very independent. Now unable to walk and has had to use briefs for toileting.      Hand Dominance        Extremity/Trunk Assessment   Upper Extremity Assessment Upper Extremity Assessment: Generalized weakness    Lower Extremity Assessment Lower Extremity Assessment: Generalized weakness;RLE deficits/detail;LLE deficits/detail RLE Deficits / Details: Increased pain throughout RLE LLE Deficits / Details: Increased pain throughout LLE     Cervical / Trunk Assessment Cervical / Trunk Assessment: Kyphotic  Communication   Communication: No difficulties  Cognition Arousal/Alertness: Awake/alert Behavior During Therapy: WFL for tasks assessed/performed Overall Cognitive Status: History of cognitive impairments - at baseline                                 General Comments: dementia at baseline       General Comments General comments (skin integrity, edema, etc.): Pt's daughter in law present during session     Exercises     Assessment/Plan    PT Assessment  Patient needs continued PT services  PT Problem List Decreased strength;Decreased activity tolerance;Decreased balance;Decreased mobility;Decreased range of motion;Decreased knowledge of use of DME;Decreased knowledge of precautions;Pain       PT Treatment Interventions Gait training;DME instruction;Stair training;Functional mobility training;Therapeutic activities;Therapeutic exercise;Balance training;Patient/family education    PT Goals (Current goals can be found in the Care Plan section)  Acute Rehab PT Goals Patient Stated Goal: to go to rehab PT Goal Formulation: With patient Time For Goal Achievement: 01/05/20 Potential to Achieve Goals: Fair    Frequency Min 2X/week   Barriers to discharge         Co-evaluation               AM-PAC PT "6 Clicks" Mobility  Outcome Measure Help needed turning from your back to your side while in a flat bed without using bedrails?: Total Help needed moving from lying on your back to sitting on the side of a flat bed without using bedrails?: Total Help needed moving to and from a bed to a chair (including a wheelchair)?: Total Help needed standing up from a chair using your arms (e.g., wheelchair or bedside chair)?: Total Help needed to walk in hospital room?: Total Help needed climbing 3-5 steps with a railing? : Total 6 Click Score: 6    End of Session   Activity Tolerance: Patient limited by pain Patient left: in bed;with family/visitor present;with call bell/phone within reach (stretcher) Nurse Communication: Mobility status PT Visit Diagnosis: Other abnormalities of gait and mobility (R26.89);Pain Pain - Right/Left:  (bilateral) Pain - part of body: Leg    Time: 1700-1715 PT Time Calculation (min) (ACUTE ONLY): 15 min   Charges:   PT Evaluation $PT Eval Moderate Complexity: 1 Mod          Reuel Derby, PT, DPT  Acute Rehabilitation Services  Pager: [phone] Office: [phone]   Rudean Hitt 12/22/2019, 6:49 PM

## 2019-12-22 NOTE — Progress Notes (Signed)
Lower extremity venous bilateral study completed  Preliminary results relayed to MD.  See CV Proc for preliminary results report.   Lisa Crane

## 2019-12-22 NOTE — Progress Notes (Signed)
   12/22/19 1807  TOC Assessment  Expected Discharge Plan [facility]  Final next level of care Skilled Nursing Facility  Barriers to Discharge Continued Medical Work up  Patient states their goals for this hospitalization and ongoing recovery are: Walk again  CMS Medicare.gov Compare Post Acute Care list provided to: Patient Represenative (must comment) Suha, [place] Daughter [phone])  Choice offered to / list presented to  Adult Children Amedeo Plenty- Ahmira, [place] Daughter [phone])  Living arrangements for the past 2 months Single Family Home  Lives with: Self;Adult Children  Do you feel safe going back to the place where you live? Y  Permission sought to share information with  Customer service manager;Family Supports  Permission granted to share information with  Yes, Verbal Permission Granted  Share Information with NAME Krissi, [place] Daughter [phone]  Permission granted to share info w Relationship Alyvia, [place] Daughter [phone]  Permission granted to share info w Contact Information Velma, Agnes Daughter [phone]  Patient language and need for interpreter reviewed: No (Not necessary)  Appearance: Appears younger than stated age;Well-Groomed  Attitude/Demeanor/Rapport Other (comment) (Sleepy)  Affect (typically observed) Pleasant  Orientation:  Oriented to Self;Oriented to Situation  Alcohol / Substance Use Not Applicable  Psych Involvement N  CSW met with Pt and caregiver daughter, Jeneen Montgomery at bedside to discuss SNF process and gather information.  CSW will begin process.

## 2019-12-23 LAB — URINE CULTURE: Culture: NO GROWTH

## 2019-12-23 NOTE — ED Notes (Signed)
Breakfast ordered 

## 2019-12-23 NOTE — Progress Notes (Signed)
CSW spoke with daughterLauraine, [place] @ [phone]. CSW discussed bed offers made and daughter chose [facility]. CSW spoke to [place] at [facility] to inform her of family's choice. CSW began authorization with [facility] [phone]

## 2019-12-24 NOTE — Progress Notes (Signed)
   Patient will discharge to: [facility] Discharge date: 12/24/19 Family notified: son left v/m Transport by: Corey Harold  Per MD patient is appropriate for discharge and will discharge to [facility] . RN, patient, patient's family, and facility have been notified of discharge. Assessment, FL-2, PASRR, and discharge summary sent to facillity. RN was provided with the following number for report: [phone]. [facility] will be used to transfer patient to the facility. CSW will continue to follow for any discharge supports.  Daphine Deutscher, LCSW, [place] Social Worker II Emergency Department, [place], [place] [phone]

## 2019-12-24 NOTE — ED Notes (Signed)
Pt has two untouched meal trays at home.  Pt refusing to eat.

## 2019-12-24 NOTE — Progress Notes (Signed)
CSW checked on status of insurance auth for SNF placement and notes Josem Kaufmann is currently pending. CSW will continue to follow for discharge supports

## 2019-12-24 NOTE — ED Notes (Signed)
Pt transported to rm14 to be placed on purewick due to continued urinary incontinence. Pt provided full linen change, given warm blanket & pillow for comfort. Lights dimmed, pt denies additional requests/needs at this time. Will continue to monitor.

## 2019-12-24 NOTE — ED Notes (Signed)
Report given to Sunnie Nielsen from [facility].

## 2019-12-24 NOTE — ED Notes (Signed)
Called [facility] for pt transport back to [facility]

## 2019-12-25 NOTE — ED Notes (Signed)
Pt is up to discharge and awaiting [facility] transport who has been called but due to high volume unable to come until later.

## 2020-01-31 ENCOUNTER — Emergency Department (HOSPITAL_COMMUNITY)
Admission: EM | Admit: 2020-01-31 | Discharge: 2020-02-03 | Disposition: E | Payer: Medicare HMO | Attending: Emergency Medicine | Admitting: Emergency Medicine

## 2020-01-31 DIAGNOSIS — I469 Cardiac arrest, cause unspecified: Secondary | ICD-10-CM

## 2020-01-31 DIAGNOSIS — Z87891 Personal history of nicotine dependence: Secondary | ICD-10-CM | POA: Diagnosis not present

## 2020-01-31 DIAGNOSIS — F0390 Unspecified dementia without behavioral disturbance: Secondary | ICD-10-CM | POA: Insufficient documentation

## 2020-01-31 DIAGNOSIS — E119 Type 2 diabetes mellitus without complications: Secondary | ICD-10-CM | POA: Insufficient documentation

## 2020-01-31 MED ORDER — EPINEPHRINE 1 MG/10ML IJ SOSY
0.1000 mg | PREFILLED_SYRINGE | Freq: Once | INTRAMUSCULAR | Status: AC
  Administered 2020-01-31: 0.1 mg via INTRAVENOUS

## 2020-01-31 MED ORDER — SODIUM BICARBONATE 8.4 % IV SOLN
50.0000 meq | Freq: Once | INTRAVENOUS | Status: AC
  Administered 2020-01-31: 50 meq via INTRAVENOUS

## 2020-01-31 MED FILL — Medication: Qty: 1 | Status: AC

## 2020-02-03 DIAGNOSIS — 419620001 Death: Secondary | SNOMED CT | POA: Insufficient documentation

## 2020-02-03 NOTE — ED Provider Notes (Signed)
[facility] Provider Note   CSN: [id number] Arrival date & time: February 24, 2020  0125     History Chief Complaint  Patient presents with  . Cardiac Arrest   Level 5 caveat due to acuity of condition/unresponsiveness [facility] is a 79 y.o. female.  The history is provided by the EMS personnel. The history is limited by the condition of the patient.  Cardiac Arrest Witnessed by:  Not witnessed Patient presents via EMS after cardiac arrest. It is reported the patient was down for up to 45 minutes.  Only known medical history of dementia.  It also reported the patient may have had a recent Covid exposure and had recent fevers.  EMS reports that they performed ACLS for up to an hour  Patient received multiple rounds of epinephrine and would regain pulses for periods of time then would go back into cardiac arrest.  One EKG from EMS did show signs of ST elevation    Past Medical History:  Diagnosis Date  . Diabetes mellitus   . Dyslipidemia   . Hyperlipidemia   . Left ear hearing loss   . Seasonal allergies   . Tinnitus    resolved after d/c aspirin    Patient Active Problem List   Diagnosis Date Noted  . Seasonal allergies   . Plantar fasciitis, left 01/21/2013  . COLONIC POLYPS, ADENOMATOUS 04/30/2006  . DYSLIPIDEMIA 04/30/2006  . TINNITUS 04/30/2006  . LOSS, HEARING NOS 04/30/2006  . DIABETES MELLITUS, TYPE II 02/20/2006    Past Surgical History:  Procedure Laterality Date  . BREAST LUMPECTOMY N/A   . TUMOR EXCISION N/A    ON BACK     OB History   No obstetric history on file.     Family History  Problem Relation Age of Onset  . Stroke Mother     Social History   Tobacco Use  . Smoking status: Former Smoker    Packs/day: 4.00    Years: 15.00    Pack years: 60.00    Quit date: 05/05/1970    Years since quitting: 49.7  Substance Use Topics  . Alcohol use: No  . Drug use: No    Home Medications Prior to  Admission medications   Medication Sig Start Date End Date Taking? Authorizing Provider  donepezil (ARICEPT) 10 MG tablet Take 10 mg by mouth daily. 10/04/19   [provider]  predniSONE (DELTASONE) 20 MG tablet Take 2 tablets (40 mg total) by mouth daily. Patient not taking: Reported on 12/22/2019 04/21/13   Kingsley Spittle, MD  sertraline (ZOLOFT) 50 MG tablet Take 50 mg by mouth daily. 10/04/19   [provider]  traMADol (ULTRAM) 50 MG tablet Take 1 tablet (50 mg total) by mouth every 6 (six) hours as needed for moderate pain or severe pain. Patient not taking: Reported on 12/22/2019 07/17/14   Clayton Bibles, PA-C    Allergies    Aspirin  Review of Systems   Review of Systems  Unable to perform ROS: Patient unresponsive    Physical Exam Updated Vital Signs  Physical Exam CONSTITUTIONAL: Ill-appearing, unresponsive HEAD: Normocephalic/atraumatic, no signs of trauma EYES: Pupils fixed and dilated ENMT: Copious blood in mouth.  Patient has bag-valve-mask on mouth on arrival NECK: supple no meningeal signs SPINE/BACK: No bruising noted to spine CV: No spontaneous cardiac activity LUNGS: Equal coarse breath sounds with bagging ABDOMEN: soft, nondistended NEURO: Pt is unresponsive EXTREMITIES: No deformities.  IO noted to left leg/tibia SKIN: Cool to  touch PSYCH: Unable to assess  ED Results / Procedures / Treatments   Labs (all labs ordered are listed, but only abnormal results are displayed) Labs Reviewed - No data to display  EKG None  Radiology No results found.  Procedures Procedure Name: Intubation Date/Time: Feb 11, 2020 1:30 AM Performed by: Ripley Fraise, MD Pre-anesthesia Checklist: Suction available and Patient being monitored Oxygen Delivery Method: Ambu bag Laryngoscope Size: Glidescope Grade View: Grade I Tube size: 7.5 mm Number of attempts: 1 Airway Equipment and Method: Video-laryngoscopy Placement Confirmation: ETT inserted through  vocal cords under direct vision and Breath sounds checked- equal and bilateral       CPR Procedure Note I PERSONALLY DIRECTED ANCILLARY STAFF OR/PERFORMED CPR IN AN EFFORT TO REGAIN RETURN OF SPONTANEOUS CIRCULATION IN AN EFFORT TO MAINTAIN NEURO, CARDIAC AND SYSTEMIC PERFUSION   Medications Ordered in ED Medications - No data to display  ED Course  I have reviewed the triage vital signs and the nursing notes.      MDM Rules/Calculators/A&P                          Patient seen on arrival, she arrived in cardiac arrest with CPR in progress.  Patient was receiving bag-valve-mask on arrival, EMS reports they were unable to intubate in the field.  On pulse check on arrival, patient was still without spontaneous circulation and CPR was continued.  I was able to intubate patient without difficulty with glide scope.  She did have copious blood in her mouth.  CPR was continued and patient was given epinephrine and bicarb.  After several minutes, there was no return of spontaneous circulation.  Patient did have prolonged downtime potentially up to 45 minutes, and was receiving ACLS by EMS for approximately 1 hour. Despite best efforts, unable to regain spontaneous circulation Time of death 1:39 AM Final Clinical Impression(s) / ED Diagnoses Final diagnoses:  Cardiac arrest [facility])    Rx / DC Orders ED Discharge Orders    None       Ripley Fraise, MD 02/11/20 0200

## 2020-02-03 NOTE — ED Triage Notes (Signed)
EMS called out after pt had been down for 45 min, only known med hx is dementia. EMS states pt had c/o fevers and possible covid exposure. EMS coded pt for 1 hr pta (arrived at [facility]). Gave 7 rounds of epi- pulse return after each dose, but would return to agonal rhythm. EKG from medic appears to show possible STEMI

## 2020-02-03 NOTE — Code Documentation (Signed)
Pulse check, agonal rhythm, CPR resumed

## 2020-02-03 NOTE — ED Notes (Signed)
Ellen Henri, Medical Examiner called to Dr. Christy Gentles

## 2020-02-03 NOTE — ED Provider Notes (Signed)
Discussed with medical examiner Olivia Mackie.  Patient will be sent to the morgue. Per records, PCP is Dr. Sandi Mariscal with [facility]   Ripley Fraise, MD 03-Feb-2020 [phone]

## 2020-02-03 NOTE — Code Documentation (Signed)
Pulse check, no pulse- resumed CPR

## 2020-02-03 NOTE — ED Provider Notes (Signed)
I was informed by chaplain that patient's son came to the emergency department but then left before I could speak to him   Ripley Fraise, MD 18-Feb-2020 [phone]

## 2020-02-03 NOTE — Progress Notes (Signed)
   02/26/2020 0300  Clinical Encounter Type  Visited With Health care provider  Visit Type Death  Referral From Physician  Consult/Referral To Chaplain  Chaplain responded to doctor's page. Family was not present initially. Once the family showed up there was no available consult room. Family left. Chaplain tried to called the number in the patient's chart and there was no answer. Chaplain spoke with the doctor and will follow up as needed.

## 2020-02-03 NOTE — Code Documentation (Signed)
MD looking for heart activity w [place]- no activity, no pulse, MD called time of death at 0139

## 2020-02-03 NOTE — Code Documentation (Signed)
Patient time of death occurred at 0139

## 2020-02-03 NOTE — Code Documentation (Signed)
1 dose of epi and 1 dose of bicarb given IO per MD's orders

## 2020-02-03 DEATH — deceased

## 2022-03-10 IMAGING — CT CT HEAD W/O CM
4 series · 17 of 47 positions shown, 19 images · non-contrast
Comparison: August 07, 2011

CLINICAL DATA: Altered mental status.

EXAM:
CT HEAD WITHOUT CONTRAST
TECHNIQUE: Contiguous axial images were obtained from the base of the skull
through the vertex without intravenous contrast.

[Series 3: head wo · axial · 0.39mm/px · z∈[+1334,+1449]mm · 7 of 31 slices shown, 9 images]
[im 4/31  brain]
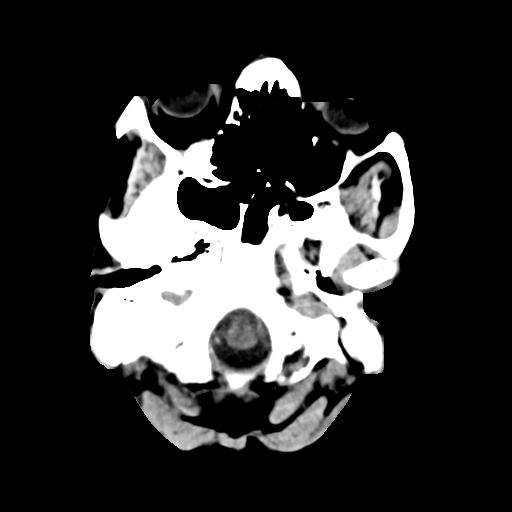
[im 4/31  bone]
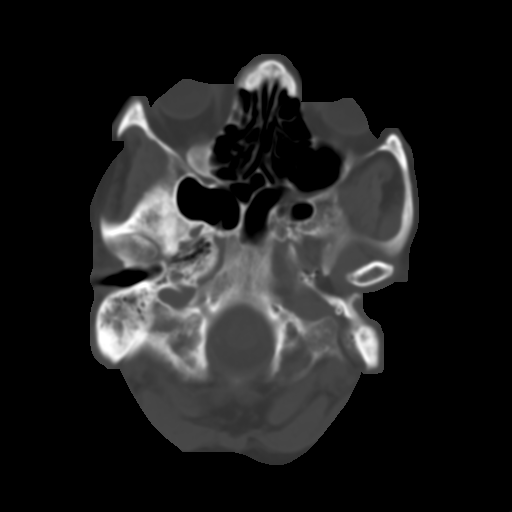
[im 8/31  brain]
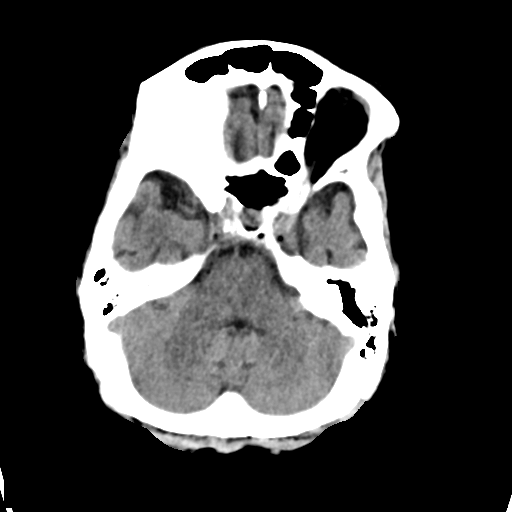
[im 12/31  brain]
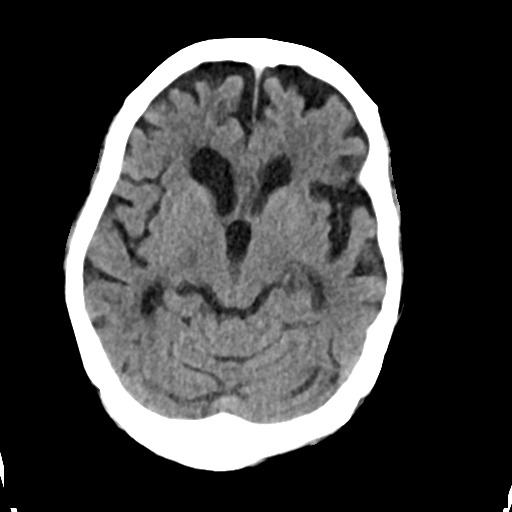
[im 16/31  brain]
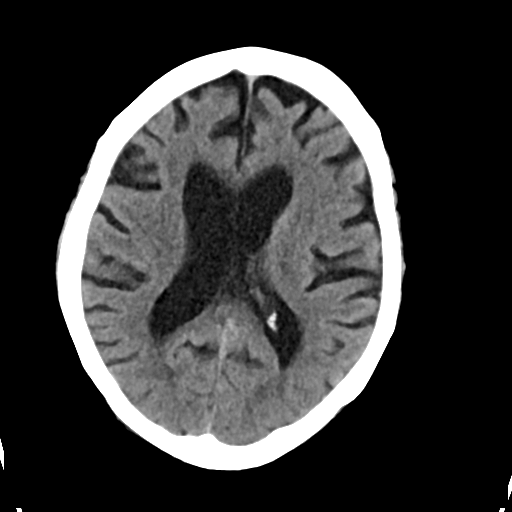
[im 19/31  brain]
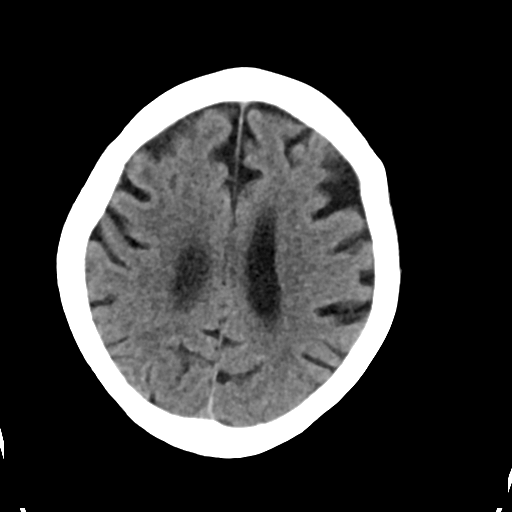
[im 19/31  bone]
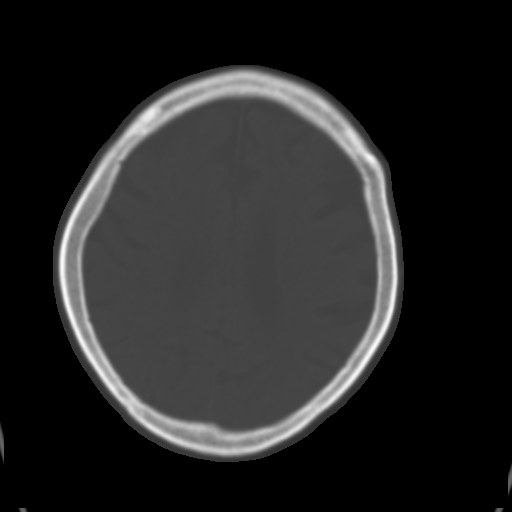
[im 23/31  brain]
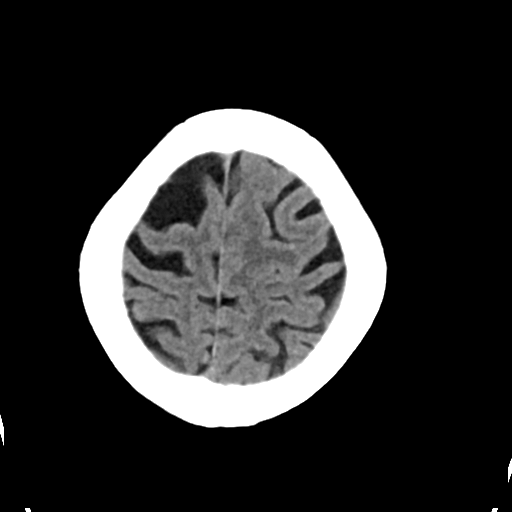
[im 27/31  brain]
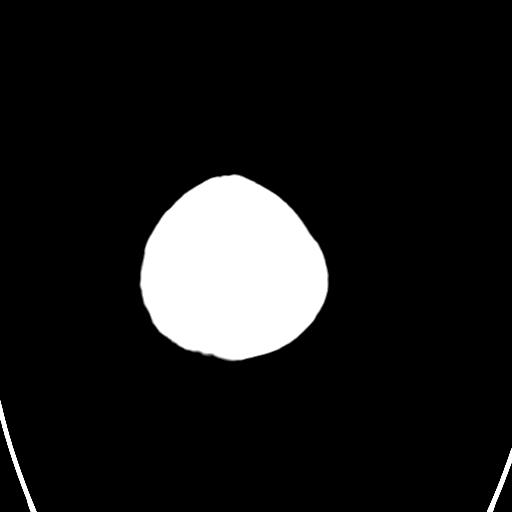

[Series 4: head bone · axial · 0.39mm/px · z∈[+1333,+1387]mm · 4 of 77 slices shown]
[im 8/77  bone]
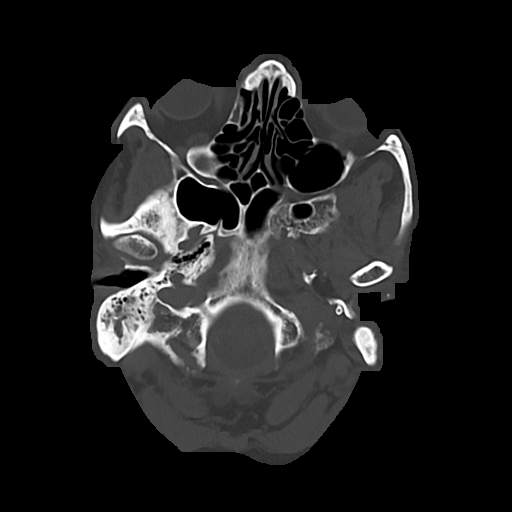
[im 16/77  bone]
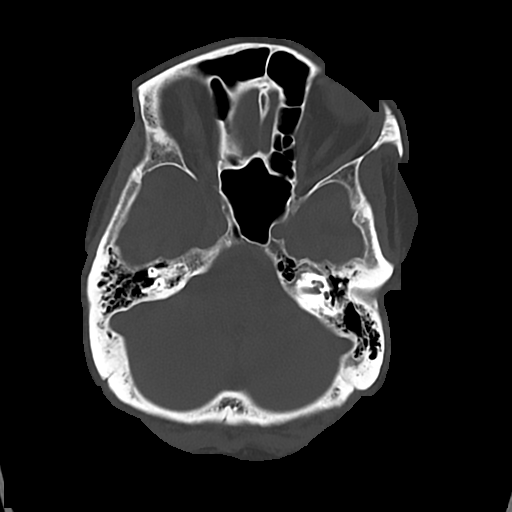
[im 23/77  bone]
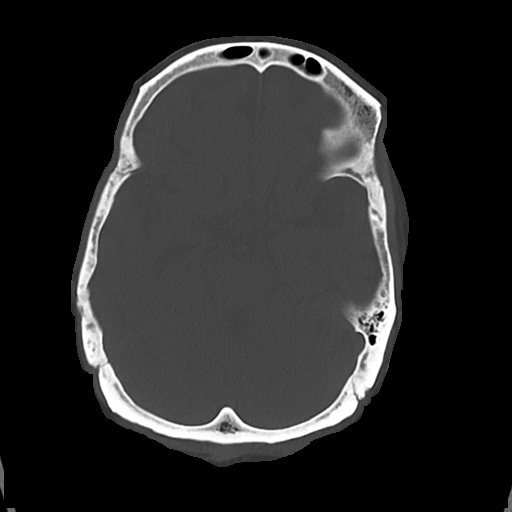
[im 35/77  bone]
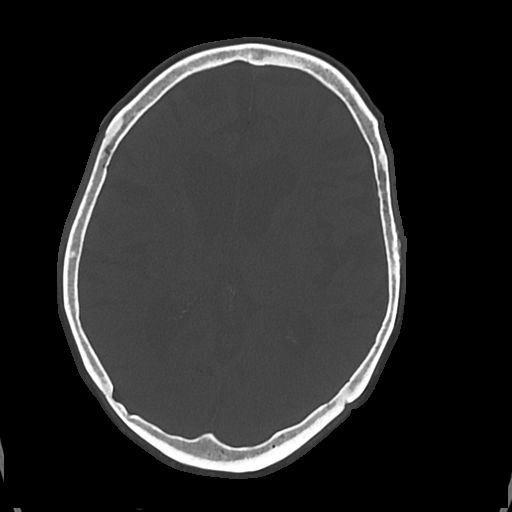

[Series 5: cor soft · coronal · 0.31mm/px · 3 of 67 slices shown]
[im 23/67  brain]
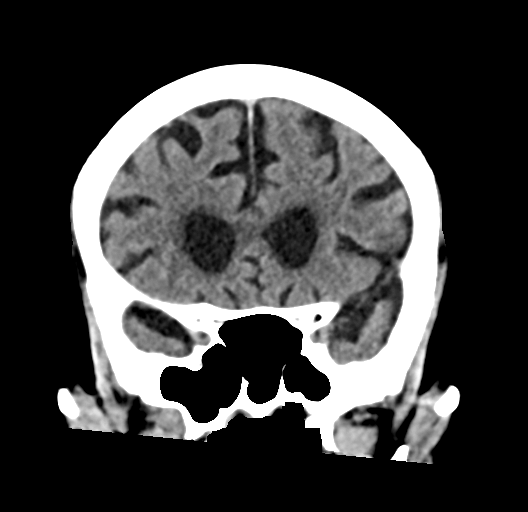
[im 30/67  brain]
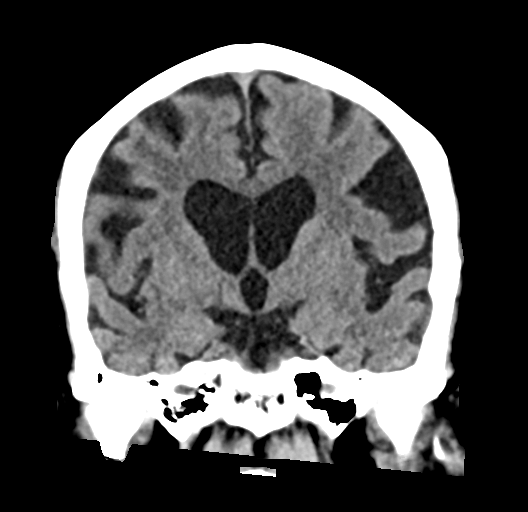
[im 37/67  brain]
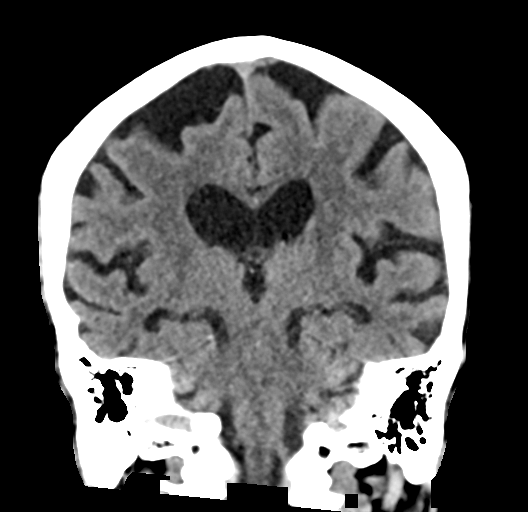

[Series 6: sag soft · sagittal · 0.35mm/px · 3 of 54 slices shown]
[im 18/54  brain]
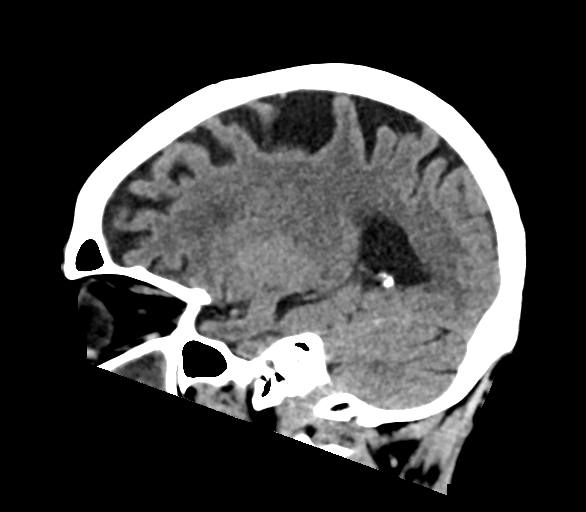
[im 27/54  brain]
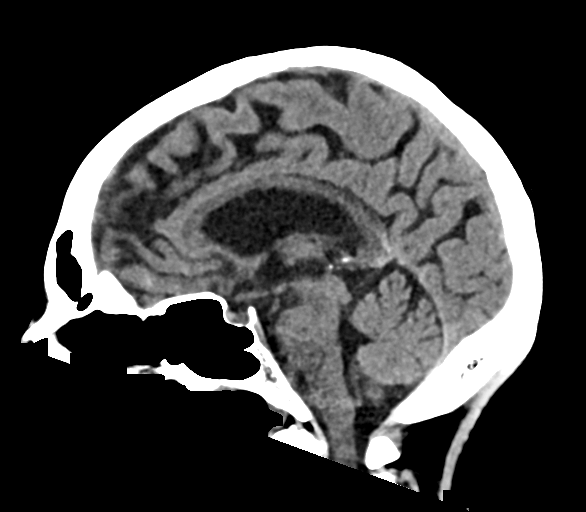
[im 36/54  brain]
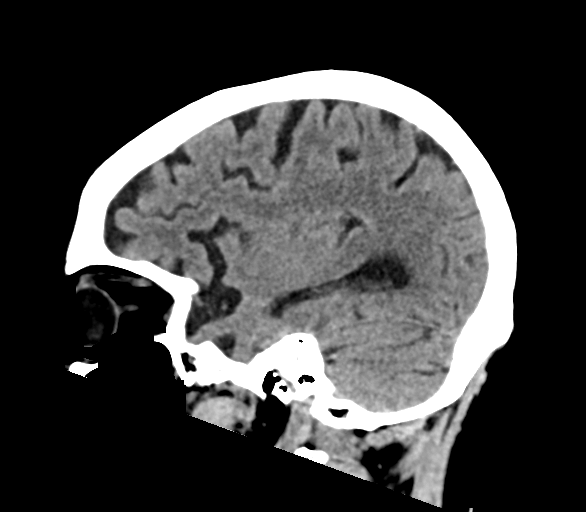

[17 of 47 positions shown; findings below may reference images not displayed]

FINDINGS: Brain: There is mild to moderate severity cerebral atrophy with
widening of the extra-axial spaces and ventricular dilatation.
There are areas of decreased attenuation within the white matter
tracts of the supratentorial brain, consistent with microvascular
disease changes.

Vascular: No hyperdense vessel or unexpected calcification.

Skull: Normal. Negative for fracture or focal lesion.

Sinuses/Orbits: There is marked severity right maxillary sinus
mucosal thickening.

Other: None.
IMPRESSION: 1. Generalized cerebral atrophy.
2. No acute intracranial abnormality.
3. Right maxillary sinus disease.

## 2022-03-11 IMAGING — DX DG CHEST 1V PORT
1 series · 1 of 1 positions shown · non-contrast
Comparison: Prior chest radiographs 08/07/2011

CLINICAL DATA: Weakness, chills.

EXAM:
PORTABLE CHEST 1 VIEW

[chest ap]
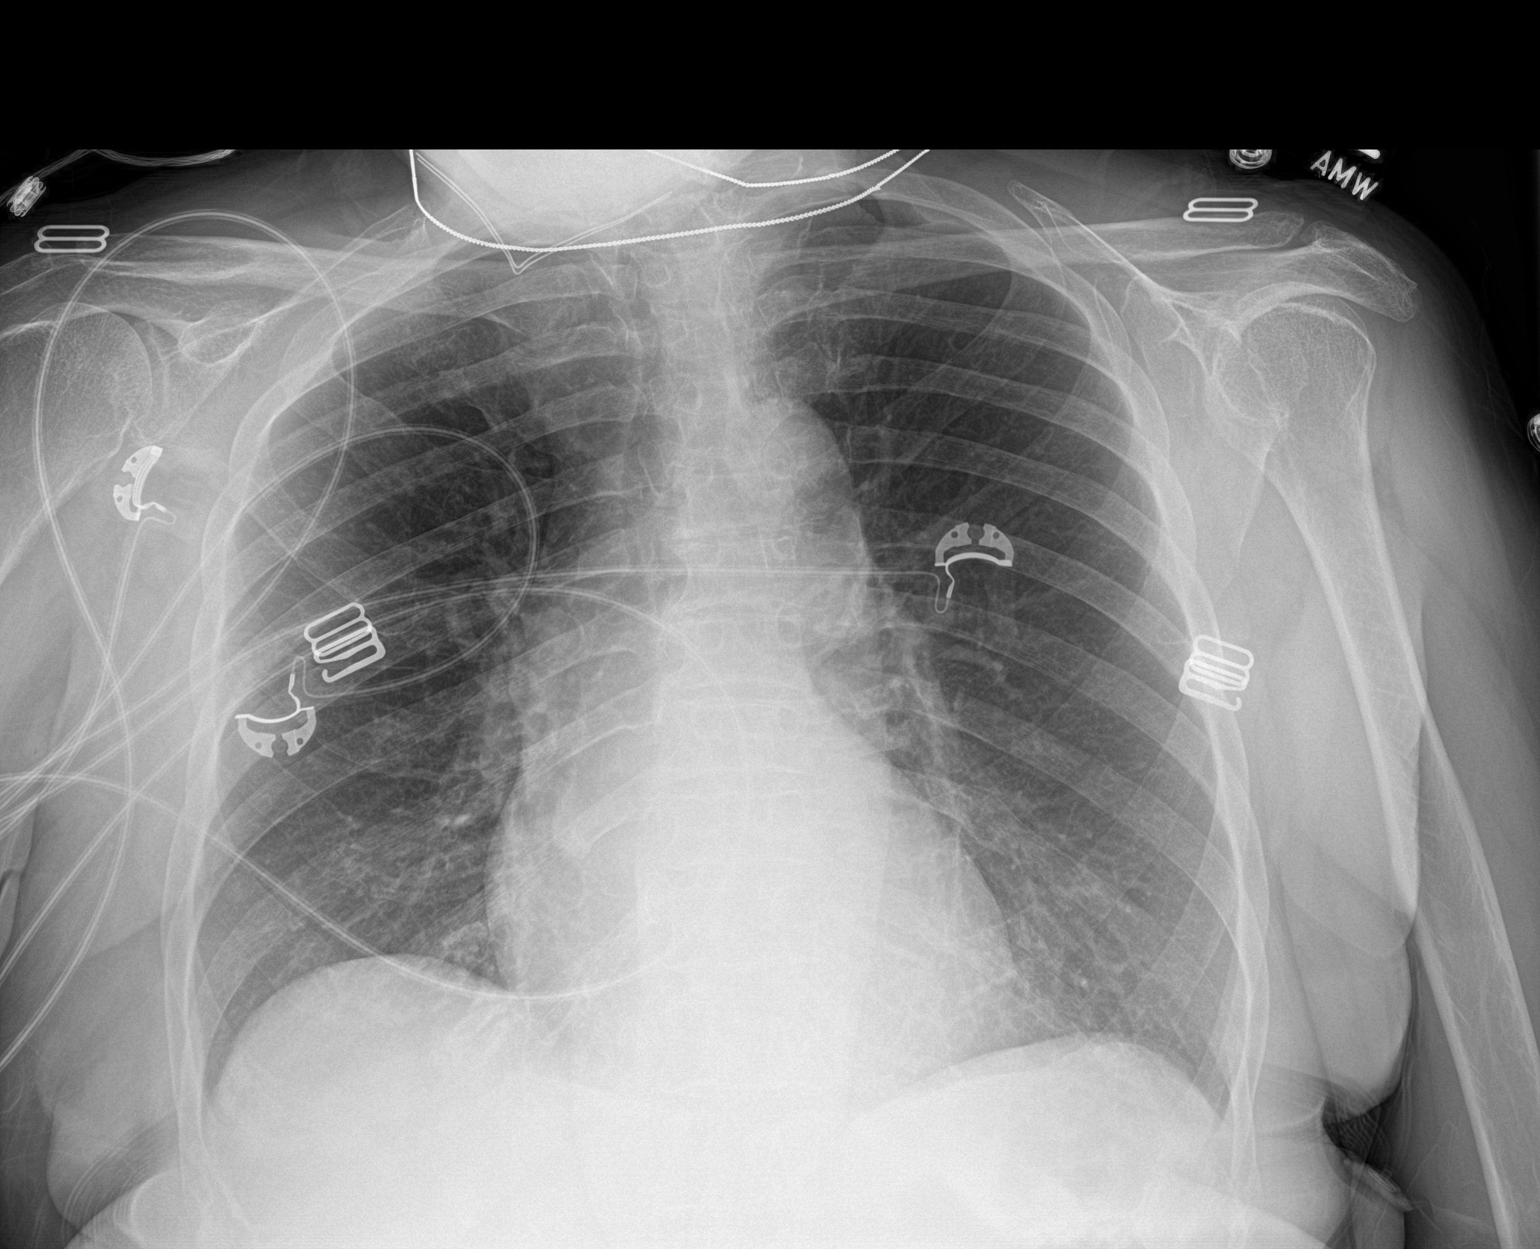

[1 of 1 positions shown; findings below may reference images not displayed]

FINDINGS: Heart size within normal limits. No appreciable airspace
consolidation or pulmonary edema. No evidence of pleural effusion or
pneumothorax. No acute bony abnormality identified.
IMPRESSION: No evidence of acute cardiopulmonary abnormality.
